# Patient Record
Sex: Male | Born: 1945 | Race: White | Hispanic: No | Marital: Married | Smoking: Current every day smoker
Health system: Southern US, Community
[De-identification: ages and names within clinical notes are randomized; demographics above are authoritative.]

## PROBLEM LIST (undated history)

## (undated) DIAGNOSIS — G20A1 Parkinson's disease without dyskinesia, without mention of fluctuations: Secondary | ICD-10-CM

## (undated) DIAGNOSIS — F0390 Unspecified dementia without behavioral disturbance: Secondary | ICD-10-CM

## (undated) DIAGNOSIS — G20 Parkinson's disease: Secondary | ICD-10-CM

## (undated) DIAGNOSIS — I10 Essential (primary) hypertension: Secondary | ICD-10-CM

## (undated) HISTORY — PX: CHOLECYSTECTOMY: SHX55

---

## 2017-01-18 ENCOUNTER — Emergency Department: Payer: Medicare Other

## 2017-01-18 ENCOUNTER — Encounter: Payer: Self-pay | Admitting: Emergency Medicine

## 2017-01-18 ENCOUNTER — Emergency Department
Admission: EM | Admit: 2017-01-18 | Discharge: 2017-01-18 | Disposition: A | Discharge status: Home / Self Care | Payer: Medicare Other | Attending: Emergency Medicine | Admitting: Emergency Medicine

## 2017-01-18 DIAGNOSIS — R296 Repeated falls: Secondary | ICD-10-CM | POA: Diagnosis not present

## 2017-01-18 DIAGNOSIS — Y9301 Activity, walking, marching and hiking: Secondary | ICD-10-CM | POA: Diagnosis not present

## 2017-01-18 DIAGNOSIS — W19XXXA Unspecified fall, initial encounter: Secondary | ICD-10-CM

## 2017-01-18 DIAGNOSIS — G20 Parkinson's disease: Secondary | ICD-10-CM | POA: Insufficient documentation

## 2017-01-18 DIAGNOSIS — Y92195 Garage of other specified residential institution as the place of occurrence of the external cause: Secondary | ICD-10-CM | POA: Insufficient documentation

## 2017-01-18 DIAGNOSIS — Y998 Other external cause status: Secondary | ICD-10-CM | POA: Insufficient documentation

## 2017-01-18 DIAGNOSIS — M6281 Muscle weakness (generalized): Secondary | ICD-10-CM | POA: Diagnosis present

## 2017-01-18 DIAGNOSIS — W010XXA Fall on same level from slipping, tripping and stumbling without subsequent striking against object, initial encounter: Secondary | ICD-10-CM | POA: Insufficient documentation

## 2017-01-18 DIAGNOSIS — M545 Low back pain: Secondary | ICD-10-CM | POA: Insufficient documentation

## 2017-01-18 HISTORY — DX: Essential (primary) hypertension: I10

## 2017-01-18 HISTORY — DX: Parkinson's disease: G20

## 2017-01-18 HISTORY — DX: Parkinson's disease without dyskinesia, without mention of fluctuations: G20.A1

## 2017-01-18 LAB — URINALYSIS, COMPLETE (UACMP) WITH MICROSCOPIC
BACTERIA UA: NONE SEEN
Bilirubin Urine: NEGATIVE
GLUCOSE, UA: NEGATIVE mg/dL
Hgb urine dipstick: NEGATIVE
KETONES UR: 5 mg/dL — AB
Leukocytes, UA: NEGATIVE
Nitrite: NEGATIVE
PROTEIN: 30 mg/dL — AB
Specific Gravity, Urine: 1.023 (ref 1.005–1.030)
pH: 6 (ref 5.0–8.0)

## 2017-01-18 LAB — CBC WITH DIFFERENTIAL/PLATELET
BASOS ABS: 0 10*3/uL (ref 0–0.1)
BASOS PCT: 0 %
EOS ABS: 0.3 10*3/uL (ref 0–0.7)
EOS PCT: 5 %
HCT: 37 % — ABNORMAL LOW (ref 40.0–52.0)
Hemoglobin: 12.7 g/dL — ABNORMAL LOW (ref 13.0–18.0)
Lymphocytes Relative: 39 %
Lymphs Abs: 2.8 10*3/uL (ref 1.0–3.6)
MCH: 30.4 pg (ref 26.0–34.0)
MCHC: 34.3 g/dL (ref 32.0–36.0)
MCV: 88.5 fL (ref 80.0–100.0)
Monocytes Absolute: 0.6 10*3/uL (ref 0.2–1.0)
Monocytes Relative: 9 %
Neutro Abs: 3.3 10*3/uL (ref 1.4–6.5)
Neutrophils Relative %: 47 %
PLATELETS: 216 10*3/uL (ref 150–440)
RBC: 4.18 MIL/uL — AB (ref 4.40–5.90)
RDW: 13.1 % (ref 11.5–14.5)
WBC: 7.1 10*3/uL (ref 3.8–10.6)

## 2017-01-18 LAB — COMPREHENSIVE METABOLIC PANEL
ALBUMIN: 4.2 g/dL (ref 3.5–5.0)
ALT: 19 U/L (ref 17–63)
AST: 23 U/L (ref 15–41)
Alkaline Phosphatase: 47 U/L (ref 38–126)
Anion gap: 4 — ABNORMAL LOW (ref 5–15)
BUN: 17 mg/dL (ref 6–20)
CHLORIDE: 106 mmol/L (ref 101–111)
CO2: 28 mmol/L (ref 22–32)
CREATININE: 0.87 mg/dL (ref 0.61–1.24)
Calcium: 8.9 mg/dL (ref 8.9–10.3)
GFR calc non Af Amer: >60 mL/min (ref >60)
GLUCOSE: 106 mg/dL — AB (ref 65–99)
Potassium: 3.3 mmol/L — ABNORMAL LOW (ref 3.5–5.1)
SODIUM: 138 mmol/L (ref 135–145)
Total Bilirubin: 0.6 mg/dL (ref 0.3–1.2)
Total Protein: 7 g/dL (ref 6.5–8.1)

## 2017-01-18 LAB — TROPONIN I: Troponin I: <0.03 ng/mL (ref <0.03)

## 2017-01-18 NOTE — ED Triage Notes (Signed)
Pt presents to ED 10 via EMS from home for multiple falls within the last 3 days; pt has hx of Parkinson's Disease. Pt c/o lower back pain, but nothing else. Pt is awake, alert and oriented x4. Pt's speaks in very low volume. Pt's wife at bedside.

## 2017-01-18 NOTE — ED Provider Notes (Signed)
[facility] Emergency Department Provider Note       Time seen: ----------------------------------------- 9:05 PM on 01/18/2017 -----------------------------------------  Level V caveat: History/ROS limited by altered mental status   I have reviewed the triage vital signs and the nursing notes.   HISTORY   Chief Complaint No chief complaint on file.    HPI Stephen Mcknight is a 71 y.o. male who presents to the ED for generalized weakness and multiple falls. Patient was brought in by EMS after falling in his carport. His wife states he has fallen multiple times over the past several days and has been increasingly weak. Reportedly has a history of Parkinson's disease. He is complaining of low back pain from the fall today and possibly hip pain but otherwise denies complaints.   No past medical history on file.  There are no active problems to display for this patient.   No past surgical history on file.  Allergies Patient has no allergy information on record.  Social History Social History  Substance Use Topics  . Smoking status: Not on file  . Smokeless tobacco: Not on file  . Alcohol use Not on file    Review of Systems Musculoskeletal: Positive for back pain  All systems negative/normal/unremarkable except as stated in the HPI  ____________________________________________   PHYSICAL EXAM:  VITAL SIGNS: ED Triage Vitals  Enc Vitals Group     BP      Pulse      Resp      Temp      Temp src      SpO2      Weight      Height      Head Circumference      Peak Flow      Pain Score      Pain Loc      Pain Edu?      Excl. in GC?     Constitutional: Lethargic and disoriented, no distress Eyes: Conjunctivae are normal. Normal extraocular movements. ENT   Head: Normocephalic and atraumatic.   Nose: No congestion/rhinnorhea.   Mouth/Throat: Mucous membranes are moist.   Neck: No stridor. Patient arrives with c-collar  in place Cardiovascular: Normal rate, regular rhythm. No murmurs, rubs, or gallops. Respiratory: Normal respiratory effort without tachypnea nor retractions. Breath sounds are clear and equal bilaterally. No wheezes/rales/rhonchi. Gastrointestinal: Soft and nontender. Normal bowel sounds Musculoskeletal: Limited range of motion of the extremities Neurologic: No gross focal neurologic deficits are appreciated. Generalized weakness, speaks in a quiet whisper Skin:  Skin is warm, dry and intact. No rash noted. ____________________________________________  EKG: Interpreted by me. Sinus rhythm with a rate of 67 bpm, normal PR interval, normal QRS, normal QT, normal axis.  ____________________________________________  ED COURSE:  Pertinent labs & imaging results that were available during my care of the patient were reviewed by me and considered in my medical decision making (see chart for details). Patient presents for weakness and frequent falls, we will assess with labs and imaging as indicated.   Procedures ____________________________________________   LABS (pertinent positives/negatives)  Labs Reviewed  CBC WITH DIFFERENTIAL/PLATELET - Abnormal; Notable for the following:       Result Value   RBC 4.18 (*)    Hemoglobin 12.7 (*)    HCT 37.0 (*)    All other components within normal limits  COMPREHENSIVE METABOLIC PANEL - Abnormal; Notable for the following:    Potassium 3.3 (*)    Glucose, Bld 106 (*)  Anion gap 4 (*)    All other components within normal limits  URINALYSIS, COMPLETE (UACMP) WITH MICROSCOPIC - Abnormal; Notable for the following:    Color, Urine YELLOW (*)    APPearance CLEAR (*)    Ketones, ur 5 (*)    Protein, ur 30 (*)    Squamous Epithelial / LPF 0-5 (*)    All other components within normal limits  TROPONIN I    RADIOLOGY  CT head, C-spine, lumbar spine, pelvis x-ray Unremarkable ____________________________________________  FINAL ASSESSMENT  AND PLAN  Frequent falls, weakness  Plan: Patient's labs and imaging were dictated above. Patient had presented for frequent falls and weakness secondary to Parkinson's that is advancing. I have discussed at length with the family, they are comfortable taking him home. He is adamant about not using a walker to ambulate. He is stable for discharge at this time.   Emily Filbert, MD   Note: This note was generated in part or whole with voice recognition software. Voice recognition is usually quite accurate but there are transcription errors that can and very often do occur. I apologize for any typographical errors that were not detected and corrected.     Emily Filbert, MD 01/18/17 2240

## 2017-11-29 ENCOUNTER — Encounter: Payer: Self-pay | Admitting: Emergency Medicine

## 2017-11-29 ENCOUNTER — Other Ambulatory Visit: Payer: Self-pay

## 2017-11-29 ENCOUNTER — Emergency Department
Admission: EM | Admit: 2017-11-29 | Discharge: 2017-11-29 | Disposition: A | Discharge status: Home / Self Care | Payer: Medicare Other | Attending: Emergency Medicine | Admitting: Emergency Medicine

## 2017-11-29 ENCOUNTER — Emergency Department: Payer: Medicare Other

## 2017-11-29 DIAGNOSIS — Y92018 Other place in single-family (private) house as the place of occurrence of the external cause: Secondary | ICD-10-CM | POA: Diagnosis not present

## 2017-11-29 DIAGNOSIS — G20 Parkinson's disease: Secondary | ICD-10-CM | POA: Insufficient documentation

## 2017-11-29 DIAGNOSIS — S8991XA Unspecified injury of right lower leg, initial encounter: Secondary | ICD-10-CM | POA: Diagnosis present

## 2017-11-29 DIAGNOSIS — W19XXXA Unspecified fall, initial encounter: Secondary | ICD-10-CM | POA: Diagnosis not present

## 2017-11-29 DIAGNOSIS — F17210 Nicotine dependence, cigarettes, uncomplicated: Secondary | ICD-10-CM | POA: Diagnosis not present

## 2017-11-29 DIAGNOSIS — Y999 Unspecified external cause status: Secondary | ICD-10-CM | POA: Insufficient documentation

## 2017-11-29 DIAGNOSIS — Y939 Activity, unspecified: Secondary | ICD-10-CM | POA: Insufficient documentation

## 2017-11-29 DIAGNOSIS — I10 Essential (primary) hypertension: Secondary | ICD-10-CM | POA: Diagnosis not present

## 2017-11-29 DIAGNOSIS — S8001XA Contusion of right knee, initial encounter: Secondary | ICD-10-CM | POA: Diagnosis not present

## 2017-11-29 LAB — CBC WITH DIFFERENTIAL/PLATELET
Basophils Absolute: 0 10*3/uL (ref 0–0.1)
Basophils Relative: 1 %
EOS ABS: 0.1 10*3/uL (ref 0–0.7)
Eosinophils Relative: 2 %
HCT: 37.5 % — ABNORMAL LOW (ref 40.0–52.0)
HEMOGLOBIN: 12.9 g/dL — AB (ref 13.0–18.0)
LYMPHS ABS: 1.9 10*3/uL (ref 1.0–3.6)
Lymphocytes Relative: 26 %
MCH: 30.9 pg (ref 26.0–34.0)
MCHC: 34.3 g/dL (ref 32.0–36.0)
MCV: 90.1 fL (ref 80.0–100.0)
Monocytes Absolute: 0.8 10*3/uL (ref 0.2–1.0)
Monocytes Relative: 11 %
NEUTROS ABS: 4.4 10*3/uL (ref 1.4–6.5)
NEUTROS PCT: 60 %
Platelets: 211 10*3/uL (ref 150–440)
RBC: 4.16 MIL/uL — AB (ref 4.40–5.90)
RDW: 13.1 % (ref 11.5–14.5)
WBC: 7.2 10*3/uL (ref 3.8–10.6)

## 2017-11-29 LAB — URINALYSIS, COMPLETE (UACMP) WITH MICROSCOPIC
BACTERIA UA: NONE SEEN
Bilirubin Urine: NEGATIVE
Glucose, UA: NEGATIVE mg/dL
Ketones, ur: 5 mg/dL — AB
Leukocytes, UA: NEGATIVE
Nitrite: NEGATIVE
Protein, ur: 100 mg/dL — AB
SPECIFIC GRAVITY, URINE: 1.02 (ref 1.005–1.030)
SQUAMOUS EPITHELIAL / LPF: NONE SEEN (ref 0–5)
pH: 6 (ref 5.0–8.0)

## 2017-11-29 LAB — COMPREHENSIVE METABOLIC PANEL
ALBUMIN: 4.1 g/dL (ref 3.5–5.0)
ALK PHOS: 87 U/L (ref 38–126)
ALT: 5 U/L — AB (ref 17–63)
AST: 22 U/L (ref 15–41)
Anion gap: 8 (ref 5–15)
BUN: 16 mg/dL (ref 6–20)
CALCIUM: 8.9 mg/dL (ref 8.9–10.3)
CO2: 27 mmol/L (ref 22–32)
CREATININE: 0.9 mg/dL (ref 0.61–1.24)
Chloride: 102 mmol/L (ref 101–111)
GFR calc non Af Amer: >60 mL/min (ref >60)
GLUCOSE: 110 mg/dL — AB (ref 65–99)
Potassium: 4 mmol/L (ref 3.5–5.1)
SODIUM: 137 mmol/L (ref 135–145)
Total Bilirubin: 0.4 mg/dL (ref 0.3–1.2)
Total Protein: 7.2 g/dL (ref 6.5–8.1)

## 2017-11-29 LAB — TROPONIN I

## 2017-11-29 MED ORDER — OXYCODONE-ACETAMINOPHEN 5-325 MG PO TABS
2.0000 | ORAL_TABLET | Freq: Once | ORAL | Status: AC
  Administered 2017-11-29: 2 via ORAL
  Filled 2017-11-29: qty 2

## 2017-11-29 NOTE — ED Triage Notes (Addendum)
PT to ED via EMS from home with c/o fall, pt c/o RT knee pain.Denies head injury,.  PT  .A&Ox4  MD at bedside . VSS

## 2017-11-29 NOTE — Care Management (Signed)
RNCM notified by CSW that patient is followed by [facility]. I have sent new orders to [facility]. CSW is providing daughter list of private duty agencies to accommodate patient in home with personal care under private pay.

## 2017-11-29 NOTE — ED Notes (Signed)
Pt ambulated with walker, steady gait noted. Per family pt at baseline ambulation. MD made aware

## 2017-11-29 NOTE — Clinical Social Work Note (Signed)
CSW received consult for "ED." CSW staffed with EDP Dr. Jimmye Norman. Patient from home with spouse. Patient presented to the ED for a fall and family wanting placement for patient. CSW met with patient, spouse, and daughter at bedside. Patient has Parkinson's and was sleeping when CSW in the room. Daughter concerned that patient needs placement into a [facility] (SNF), as patient's spouse has a brain tumor and, per daughter, is not able to care for patient. CSW explained the 3 night inpatient stay requirement for Medicare to pay for rehab and patient cannot pay privately for SNF. CSW discussed and provided resources for the process of placing in SNF from home, applying for Medicaid, Private duty nursing list, home health, DSS Services, and PACE information. Daughter agreeable for PACE referral. CSW faxed in PACE referral. Daughter stated patient has home health services with [facility] and would like to continue or increase those services. CSW updated RNCM Levada Dy and EDP Dr. Jimmye Norman. CSW signing off as no further Social Work interventions needed.   Oretha Ellis, Latanya Presser, [place] Social Worker-ED [phone]

## 2017-11-29 NOTE — ED Notes (Signed)
Pt wheeled to car with moderate assistance to get into vehicle.

## 2017-11-29 NOTE — ED Notes (Signed)
At discharge Pt family is requesting pt be admitted to nursing facility , unknown if he has PCP per granddaughter. MD made aware

## 2017-11-29 NOTE — ED Provider Notes (Addendum)
[facility] Emergency Department Provider Note       Time seen: ----------------------------------------- 7:54 AM on 11/29/2017 -----------------------------------------  Level V caveat: History/ROS limited by dementia I have reviewed the triage vital signs and the nursing notes.  HISTORY   Chief Complaint No chief complaint on file.    HPI Stephen Mcknight is a 72 y.o. male with a history of hypertension and Parkinson's disease who presents to the ED for a fall.  Patient is somewhat of a poor historian but his only complaint is right knee pain.  Patient presents from home after this fall, no explanation was given for the fall.  Patient does not think he hit his head and again he has no other complaints at this time.  The fall occurred sometime this morning.  Past Medical History:  Diagnosis Date  . Hypertension   . Parkinson disease (HCC)     There are no active problems to display for this patient.   Past Surgical History:  Procedure Laterality Date  . CHOLECYSTECTOMY      Allergies Patient has no allergy information on record.  Social History Social History   Tobacco Use  . Smoking status: Current Every Day Smoker    Packs/day: 1.00    Types: Cigarettes  . Smokeless tobacco: Never Used  Substance Use Topics  . Alcohol use: No  . Drug use: No   Review of Systems Constitutional: Negative for fever. Cardiovascular: Negative for chest pain. Respiratory: Negative for shortness of breath. Gastrointestinal: Negative for abdominal pain, vomiting and diarrhea. Musculoskeletal: Positive right knee pain Skin: Negative for rash. Neurological: Negative for headaches  All systems negative/normal/unremarkable except as stated in the HPI  ____________________________________________   PHYSICAL EXAM:  VITAL SIGNS: ED Triage Vitals  Enc Vitals Group     BP      Pulse      Resp      Temp      Temp src      SpO2      Weight      Height       Head Circumference      Peak Flow      Pain Score      Pain Loc      Pain Edu?      Excl. in GC?    Constitutional: Alert,  Well appearing and in no distress. Eyes: Conjunctivae are normal.  ENT   Head: Normocephalic and atraumatic.   Nose: No congestion/rhinnorhea.   Mouth/Throat: Mucous membranes are moist.   Neck: No stridor. Cardiovascular: Normal rate, regular rhythm. No murmurs, rubs, or gallops. Respiratory: Normal respiratory effort without tachypnea nor retractions. Breath sounds are clear and equal bilaterally. No wheezes/rales/rhonchi. Gastrointestinal: Soft and nontender Musculoskeletal: Pain with range of motion of the right knee, possible right knee contusion noted. Neurologic:  Normal speech and language. No gross focal neurologic deficits are appreciated.  Skin: Scattered contusions over the extremities Psychiatric: Flat affect ____________________________________________  EKG: Sinus rhythm rate 65 bpm, normal PR interval, borderline wide QRS, normal QT.  ED COURSE:  As part of my medical decision making, I reviewed the following data within the electronic MEDICAL RECORD NUMBER History obtained from family if available, nursing notes, old chart and ekg, as well as notes from prior ED visits. Patient presented for a fall, we will assess with imaging as indicated at this time.   Procedures ____________________________________________   Labs Reviewed  CBC WITH DIFFERENTIAL/PLATELET - Abnormal; Notable for the following components:  Result Value   RBC 4.16 (*)    Hemoglobin 12.9 (*)    HCT 37.5 (*)    All other components within normal limits  COMPREHENSIVE METABOLIC PANEL - Abnormal; Notable for the following components:   Glucose, Bld 110 (*)    ALT 5 (*)    All other components within normal limits  URINALYSIS, COMPLETE (UACMP) WITH MICROSCOPIC - Abnormal; Notable for the following components:   Color, Urine YELLOW (*)    APPearance CLEAR  (*)    Hgb urine dipstick SMALL (*)    Ketones, ur 5 (*)    Protein, ur 100 (*)    All other components within normal limits  TROPONIN I     RADIOLOGY Images were viewed by me  Right knee x-rays, pelvis x-ray Did not reveal any acute process ____________________________________________  DIFFERENTIAL DIAGNOSIS   Contusion, fracture, abrasion  FINAL ASSESSMENT AND PLAN  Fall, Parkinson's disease, right knee contusion   Plan: The patient had presented for right knee pain from a fall. Patient's imaging did not reveal any fractures, he was able to ambulate here using a walker which is his normal.  Medically he does not meet any admission criteria,  he is cleared for outpatient follow-up.   Ulice Dash, MD   Note: This note was generated in part or whole with voice recognition software. Voice recognition is usually quite accurate but there are transcription errors that can and very often do occur. I apologize for any typographical errors that were not detected and corrected.     Emily Filbert, MD 11/29/17 1610    Emily Filbert, MD 11/29/17 [phone]

## 2019-02-20 ENCOUNTER — Emergency Department: Payer: Medicare Other

## 2019-02-20 ENCOUNTER — Other Ambulatory Visit: Payer: Self-pay

## 2019-02-20 ENCOUNTER — Inpatient Hospital Stay
Admission: EM | Admit: 2019-02-20 | Discharge: 2019-02-24 | DRG: 558 | Disposition: A | Discharge status: Skilled Nursing Facility | Payer: Medicare Other | Attending: Internal Medicine | Admitting: Internal Medicine

## 2019-02-20 DIAGNOSIS — F17210 Nicotine dependence, cigarettes, uncomplicated: Secondary | ICD-10-CM | POA: Diagnosis present

## 2019-02-20 DIAGNOSIS — R7989 Other specified abnormal findings of blood chemistry: Secondary | ICD-10-CM | POA: Diagnosis present

## 2019-02-20 DIAGNOSIS — R296 Repeated falls: Secondary | ICD-10-CM | POA: Diagnosis present

## 2019-02-20 DIAGNOSIS — G20 Parkinson's disease: Secondary | ICD-10-CM | POA: Diagnosis present

## 2019-02-20 DIAGNOSIS — M25551 Pain in right hip: Secondary | ICD-10-CM | POA: Diagnosis present

## 2019-02-20 DIAGNOSIS — Z66 Do not resuscitate: Secondary | ICD-10-CM | POA: Diagnosis present

## 2019-02-20 DIAGNOSIS — I10 Essential (primary) hypertension: Secondary | ICD-10-CM | POA: Diagnosis present

## 2019-02-20 DIAGNOSIS — Z20828 Contact with and (suspected) exposure to other viral communicable diseases: Secondary | ICD-10-CM | POA: Diagnosis present

## 2019-02-20 DIAGNOSIS — W19XXXA Unspecified fall, initial encounter: Secondary | ICD-10-CM | POA: Diagnosis present

## 2019-02-20 DIAGNOSIS — S0011XA Contusion of right eyelid and periocular area, initial encounter: Secondary | ICD-10-CM | POA: Diagnosis present

## 2019-02-20 DIAGNOSIS — R531 Weakness: Secondary | ICD-10-CM | POA: Diagnosis present

## 2019-02-20 DIAGNOSIS — R011 Cardiac murmur, unspecified: Secondary | ICD-10-CM | POA: Diagnosis present

## 2019-02-20 DIAGNOSIS — R55 Syncope and collapse: Secondary | ICD-10-CM

## 2019-02-20 DIAGNOSIS — Z833 Family history of diabetes mellitus: Secondary | ICD-10-CM | POA: Diagnosis not present

## 2019-02-20 DIAGNOSIS — I248 Other forms of acute ischemic heart disease: Secondary | ICD-10-CM | POA: Diagnosis present

## 2019-02-20 DIAGNOSIS — M25522 Pain in left elbow: Secondary | ICD-10-CM | POA: Diagnosis present

## 2019-02-20 DIAGNOSIS — M6282 Rhabdomyolysis: Principal | ICD-10-CM | POA: Diagnosis present

## 2019-02-20 DIAGNOSIS — Z7982 Long term (current) use of aspirin: Secondary | ICD-10-CM

## 2019-02-20 DIAGNOSIS — E876 Hypokalemia: Secondary | ICD-10-CM | POA: Diagnosis present

## 2019-02-20 LAB — URINALYSIS, COMPLETE (UACMP) WITH MICROSCOPIC
Bacteria, UA: NONE SEEN
Bilirubin Urine: NEGATIVE
Glucose, UA: NEGATIVE mg/dL
Ketones, ur: 20 mg/dL — AB
Leukocytes,Ua: NEGATIVE
Nitrite: NEGATIVE
Protein, ur: 100 mg/dL — AB
Specific Gravity, Urine: 1.023 (ref 1.005–1.030)
pH: 5 (ref 5.0–8.0)

## 2019-02-20 LAB — COMPREHENSIVE METABOLIC PANEL
ALT: 30 U/L (ref 0–44)
AST: 31 U/L (ref 15–41)
Albumin: 4.7 g/dL (ref 3.5–5.0)
Alkaline Phosphatase: 51 U/L (ref 38–126)
Anion gap: 10 (ref 5–15)
BUN: 32 mg/dL — ABNORMAL HIGH (ref 8–23)
CO2: 26 mmol/L (ref 22–32)
Calcium: 9.2 mg/dL (ref 8.9–10.3)
Chloride: 103 mmol/L (ref 98–111)
Creatinine, Ser: 0.97 mg/dL (ref 0.61–1.24)
GFR calc Af Amer: >60 mL/min (ref >60)
GFR calc non Af Amer: >60 mL/min (ref >60)
Glucose, Bld: 140 mg/dL — ABNORMAL HIGH (ref 70–99)
Potassium: 3.9 mmol/L (ref 3.5–5.1)
Sodium: 139 mmol/L (ref 135–145)
Total Bilirubin: 0.7 mg/dL (ref 0.3–1.2)
Total Protein: 7.9 g/dL (ref 6.5–8.1)

## 2019-02-20 LAB — CK: Total CK: 721 U/L — ABNORMAL HIGH (ref 49–397)

## 2019-02-20 LAB — CBC WITH DIFFERENTIAL/PLATELET
Abs Immature Granulocytes: 0.04 10*3/uL (ref 0.00–0.07)
Basophils Absolute: 0 10*3/uL (ref 0.0–0.1)
Basophils Relative: 0 %
Eosinophils Absolute: 0 10*3/uL (ref 0.0–0.5)
Eosinophils Relative: 0 %
HCT: 37 % — ABNORMAL LOW (ref 39.0–52.0)
Hemoglobin: 12.7 g/dL — ABNORMAL LOW (ref 13.0–17.0)
Immature Granulocytes: 0 %
Lymphocytes Relative: 4 %
Lymphs Abs: 0.5 10*3/uL — ABNORMAL LOW (ref 0.7–4.0)
MCH: 30.6 pg (ref 26.0–34.0)
MCHC: 34.3 g/dL (ref 30.0–36.0)
MCV: 89.2 fL (ref 80.0–100.0)
Monocytes Absolute: 0.4 10*3/uL (ref 0.1–1.0)
Monocytes Relative: 3 %
Neutro Abs: 11.7 10*3/uL — ABNORMAL HIGH (ref 1.7–7.7)
Neutrophils Relative %: 93 %
Platelets: 209 10*3/uL (ref 150–400)
RBC: 4.15 MIL/uL — ABNORMAL LOW (ref 4.22–5.81)
RDW: 12 % (ref 11.5–15.5)
WBC: 12.7 10*3/uL — ABNORMAL HIGH (ref 4.0–10.5)
nRBC: 0 % (ref 0.0–0.2)

## 2019-02-20 LAB — SARS CORONAVIRUS 2 BY RT PCR (HOSPITAL ORDER, PERFORMED IN ~~LOC~~ HOSPITAL LAB): SARS Coronavirus 2: NEGATIVE

## 2019-02-20 LAB — TROPONIN I (HIGH SENSITIVITY)
Troponin I (High Sensitivity): 65 ng/L — ABNORMAL HIGH (ref <18)
Troponin I (High Sensitivity): 93 ng/L — ABNORMAL HIGH (ref <18)
Troponin I (High Sensitivity): 92 ng/L — ABNORMAL HIGH (ref <18)

## 2019-02-20 MED ORDER — ADULT MULTIVITAMIN W/MINERALS CH
1.0000 | ORAL_TABLET | Freq: Every day | ORAL | Status: DC
  Administered 2019-02-21 – 2019-02-24 (×4): 1 via ORAL
  Filled 2019-02-20 (×5): qty 1

## 2019-02-20 MED ORDER — METHOCARBAMOL 500 MG PO TABS
1000.0000 mg | ORAL_TABLET | Freq: Every evening | ORAL | Status: DC | PRN
  Filled 2019-02-20: qty 2

## 2019-02-20 MED ORDER — SODIUM CHLORIDE 0.9 % IV SOLN
INTRAVENOUS | Status: DC
  Administered 2019-02-20 – 2019-02-24 (×9): via INTRAVENOUS

## 2019-02-20 MED ORDER — PANTOPRAZOLE SODIUM 40 MG PO TBEC
40.0000 mg | DELAYED_RELEASE_TABLET | Freq: Every day | ORAL | Status: DC
  Administered 2019-02-21 – 2019-02-24 (×4): 40 mg via ORAL
  Filled 2019-02-20 (×5): qty 1

## 2019-02-20 MED ORDER — CARBIDOPA-LEVODOPA 25-100 MG PO TABS
2.0000 | ORAL_TABLET | Freq: Every day | ORAL | Status: DC
  Administered 2019-02-21 – 2019-02-24 (×16): 2 via ORAL
  Filled 2019-02-20 (×24): qty 2

## 2019-02-20 MED ORDER — SODIUM CHLORIDE 0.9 % IV BOLUS
500.0000 mL | Freq: Once | INTRAVENOUS | Status: AC
  Administered 2019-02-20: 500 mL via INTRAVENOUS

## 2019-02-20 MED ORDER — OXYBUTYNIN CHLORIDE 5 MG PO TABS
5.0000 mg | ORAL_TABLET | Freq: Every day | ORAL | Status: DC
  Administered 2019-02-21 – 2019-02-23 (×3): 5 mg via ORAL
  Filled 2019-02-20 (×3): qty 1

## 2019-02-20 MED ORDER — ENOXAPARIN SODIUM 40 MG/0.4ML ~~LOC~~ SOLN
40.0000 mg | SUBCUTANEOUS | Status: DC
  Administered 2019-02-20 – 2019-02-23 (×4): 40 mg via SUBCUTANEOUS
  Filled 2019-02-20 (×4): qty 0.4

## 2019-02-20 MED ORDER — MIRTAZAPINE 15 MG PO TABS
15.0000 mg | ORAL_TABLET | Freq: Every day | ORAL | Status: DC
  Administered 2019-02-21 – 2019-02-23 (×3): 15 mg via ORAL
  Filled 2019-02-20 (×3): qty 1

## 2019-02-20 MED ORDER — ASPIRIN EC 81 MG PO TBEC
81.0000 mg | DELAYED_RELEASE_TABLET | Freq: Every day | ORAL | Status: DC
  Administered 2019-02-21 – 2019-02-24 (×4): 81 mg via ORAL
  Filled 2019-02-20 (×5): qty 1

## 2019-02-20 MED ORDER — PRAMIPEXOLE DIHYDROCHLORIDE 0.25 MG PO TABS
0.5000 mg | ORAL_TABLET | Freq: Two times a day (BID) | ORAL | Status: DC
  Administered 2019-02-21 – 2019-02-24 (×6): 0.5 mg via ORAL
  Filled 2019-02-20 (×7): qty 2

## 2019-02-20 MED ORDER — MULTIVITAMINS PO CAPS: 1.0000 | ORAL_CAPSULE | Freq: Every day | ORAL | Status: DC

## 2019-02-20 MED ORDER — DOCUSATE SODIUM 100 MG PO CAPS: 100.0000 mg | ORAL_CAPSULE | Freq: Two times a day (BID) | ORAL | Status: DC | PRN

## 2019-02-20 MED ORDER — LABETALOL HCL 5 MG/ML IV SOLN
10.0000 mg | Freq: Once | INTRAVENOUS | Status: AC
  Administered 2019-02-20: 18:00:00 10 mg via INTRAVENOUS
  Filled 2019-02-20: qty 4

## 2019-02-20 NOTE — H&P (Addendum)
agree with detailed note below. Patient presentation and plan discussed on rounds with APP.    [facility] [place] [facility]   PATIENT NAME: Stephen Mcknight    MR#:  [id number]  DATE OF BIRTH:  1945-12-23  DATE OF ADMISSION:  02/20/2019  PRIMARY CARE PHYSICIAN: Keplinger, Tawny Asal, MD   REQUESTING/REFERRING PHYSICIAN: Ileana Roup, MD  CHIEF COMPLAINT:   Chief Complaint  Patient presents with   Fall   Hip Pain   Arm Pain    HISTORY OF PRESENT ILLNESS:  73 year old male with pertinent past medical history of Parkinson's disease, strabismus, hypertension, and heart murmur who was found down by neighbor this morning.  Patient's daughter, patient has gait difficulty and gait freezing episode due to Parkinson's disease, he has trouble initiating movement and sometimes loses his balance.  He apparently has a sitter coming in to stay with him Monday through Friday.  Per patient's daughter, patient sister saw him on Wednesday and found out that he had sent to the sitter away on Monday.  This morning patient's neighbor went to check on him and found him lying face down on the floor besides the bed he apparently was trying to put his clothes on when he fell.  On arrival to the ED, he was afebrile with blood pressure 160/112 mm Hg and pulse rate 83 beats/min. There were no focal neurological deficits; he was alert but unsure exactly when he fell last he thinks he might of been during the night after taking a shower.  He does not remember the events prior to or following the fall.  On exam he was noted to have hematoma above right eye and right eye appears to have yellow discharge.  Initial labs revealed CK of 721, unremarkable CMP, WBC 12.7, initial troponin  65, urinalysis without evidence of UTI.  CT head was negative for acute intracranial abnormality, CT cervical spine showed no fracture or acute finding, CT maxillofacial showed right periorbital hematoma extending to the  inferior right forehead and right cheek. Patient will be admitted under hospitalist service for further management.  PAST MEDICAL HISTORY:   Past Medical History:  Diagnosis Date   Hypertension    Parkinson disease (HCC)     PAST SURGICAL HISTORY:   Past Surgical History:  Procedure Laterality Date   CHOLECYSTECTOMY      SOCIAL HISTORY:   Social History   Tobacco Use   Smoking status: Current Every Day Smoker    Packs/day: 1.00    Types: Cigarettes   Smokeless tobacco: Never Used  Substance Use Topics   Alcohol use: No    FAMILY HISTORY:   Family History  Problem Relation Age of Onset   Diabetes Mother    Cancer Father     DRUG ALLERGIES:  No Known Allergies  REVIEW OF SYSTEMS:   ROS Unable to obtain from patient due to minimally unresponsiveness only answers brief questions.  MEDICATIONS AT HOME:   Prior to Admission medications   Medication Sig Start Date End Date Taking? Authorizing Provider  aspirin EC 81 MG tablet Take 81 mg by mouth daily.   Yes [provider]  carbidopa-levodopa (SINEMET IR) 25-100 MG tablet Take 2 tablets by mouth 5 (five) times daily.   Yes [provider]  docusate sodium (COLACE) 100 MG capsule Take 100 mg by mouth 2 (two) times daily as needed for mild constipation or moderate constipation.    Yes [provider]  methocarbamol (ROBAXIN) 500 MG tablet  Take 1,000 mg by mouth at bedtime as needed for muscle spasms.    Yes [provider]  mirtazapine (REMERON) 15 MG tablet Take 15 mg by mouth at bedtime.    Yes [provider]  Multiple Vitamin (MULTIVITAMIN) capsule Take 1 capsule by mouth daily.   Yes [provider]  omeprazole (PRILOSEC) 20 MG capsule Take 40 mg by mouth daily.    Yes [provider]  oxybutynin (DITROPAN) 5 MG tablet Take 5 mg by mouth at bedtime.   Yes [provider]  pramipexole (MIRAPEX) 1 MG tablet Take 0.5 mg by mouth 2 (two)  times daily.   Yes [provider]      VITAL SIGNS:  Blood pressure (!) 171/89, pulse 70, temperature 98.9 F (37.2 C), temperature source Axillary, resp. rate 17, height 6\' 2"  (1.88 m), weight 60.1 kg, SpO2 100 %.  PHYSICAL EXAMINATION:   Physical Exam  GENERAL:  73 y.o.-year-old patient lying in the bed with no acute distress.  EYES: Pupils equal, round, reactive to light and accommodation. No scleral icterus. Extraocular muscles intact.  HEENT: Right periorbital hematoma, normocephalic. Oropharynx and nasopharynx clear.  NECK:  Supple, no jugular venous distention. No thyroid enlargement, no tenderness.  LUNGS: Normal breath sounds bilaterally, no wheezing, rales,rhonchi or crepitation. No use of accessory muscles of respiration.  CARDIOVASCULAR: S1, S2 normal.  Murmur present, rubs, or gallops.  ABDOMEN: Soft, nontender, nondistended. Bowel sounds present. No organomegaly or mass.  EXTREMITIES: No pedal edema, cyanosis, or clubbing. No rash or lesions. + pedal pulses MUSCULOSKELETAL: Normal bulk, and power was 5+ grip and elbow, knee, and ankle flexion and extension bilaterally.  NEUROLOGIC: asleep but arouses to deep sternal rub to answer questions.  Unable to assess orientation. Pseudophakia in both eyes.  Unable to assess rest of CN reliably.  Withdraws bilateral upper and lower extremity to painful stimuli.  Babinski is downgoing. DTR's (biceps, patellar, and achilles) 2+ and symmetric throughout. Gait not tested due to safety concern. PSYCHIATRIC: Unable to assess orientation SKIN: No obvious rash, lesion, or ulcer.   DATA REVIEWED:  LABORATORY PANEL:   CBC Recent Labs  Lab 02/20/19 1020  WBC 12.7*  HGB 12.7*  HCT 37.0*  PLT 209   ------------------------------------------------------------------------------------------------------------------  Chemistries  Recent Labs  Lab 02/20/19 1020  NA 139  K 3.9  CL 103  CO2 26  GLUCOSE 140*  BUN 32*    CREATININE 0.97  CALCIUM 9.2  AST 31  ALT 30  ALKPHOS 51  BILITOT 0.7   ------------------------------------------------------------------------------------------------------------------  Cardiac Enzymes No results for input(s): TROPONINI in the last 168 hours. ------------------------------------------------------------------------------------------------------------------  RADIOLOGY:  Ct Head Wo Contrast  Result Date: 02/20/2019 CLINICAL DATA:  Pt arrived from home via EMS for report of fall sometime between yesterday and today - he was found naked laying face down in floor beside bed - pt has a sitter Mon-Fri that he sent home Tuesday and since then has fallen 3 times - he is c/o lt arm pain and right hip pain - pt noted to have hematoma above right eye and right eye appears to have yellow discharge EXAM: CT HEAD WITHOUT CONTRAST CT MAXILLOFACIAL WITHOUT CONTRAST CT CERVICAL SPINE WITHOUT CONTRAST TECHNIQUE: Multidetector CT imaging of the head, cervical spine, and maxillofacial structures were performed using the standard protocol without intravenous contrast. Multiplanar CT image reconstructions of the cervical spine and maxillofacial structures were also generated. COMPARISON:  Head CT, 06/28/2018.  Cervical spine CT, 01/18/2017. FINDINGS: CT HEAD  FINDINGS Brain: No evidence of acute infarction, hemorrhage, hydrocephalus, extra-axial collection or mass lesion/mass effect. Vascular: No hyperdense vessel or unexpected calcification. Skull: Normal. Negative for fracture or focal lesion. Other: Right inferior frontal scalp hematoma extending to the periorbital soft tissues. CT MAXILLOFACIAL FINDINGS Osseous: No fracture or bone lesion. Orbits: There is preseptal right periorbital soft tissue swelling/hemorrhage extending to the periorbital soft tissues, and along the right cheek and lower right forehead. No injury to the right globe or postseptal orbit. Left globe and orbit are unremarkable.  Sinuses: Clear sinuses, mastoid air cells and middle ear cavities. Soft tissues: No soft tissue masses.  No adenopathy. CT CERVICAL SPINE FINDINGS Alignment: Normal. Skull base and vertebrae: No acute fracture. No primary bone lesion or focal pathologic process. Soft tissues and spinal canal: No prevertebral fluid or swelling. No visible canal hematoma. No soft tissue masses or adenopathy. Disc levels: Moderate loss of disc height at C5-C6 and C6-C7 with spondylotic disc bulging and endplate spurring. Facet degenerative changes are noted most evident the left at C2-C3 and C4-C5. No convincing disc herniation. Upper chest: No acute findings. Chronic scarring at the lung apices. Other: None. IMPRESSION: HEAD CT 1. No intracranial abnormality. 2. No skull fracture. MAXILLOFACIAL CT 1. Right periorbital soft tissue swelling/hematoma extending to the inferior right forehead and right cheek. No injury to the right globe or postseptal orbit. No other acute finding. 2. No fractures. CERVICAL CT 1. No fracture or acute finding. Electronically Signed   By: Amie Portland M.D.   On: 02/20/2019 11:35   Ct Cervical Spine Wo Contrast  Result Date: 02/20/2019 CLINICAL DATA:  Pt arrived from home via EMS for report of fall sometime between yesterday and today - he was found naked laying face down in floor beside bed - pt has a sitter Mon-Fri that he sent home Tuesday and since then has fallen 3 times - he is c/o lt arm pain and right hip pain - pt noted to have hematoma above right eye and right eye appears to have yellow discharge EXAM: CT HEAD WITHOUT CONTRAST CT MAXILLOFACIAL WITHOUT CONTRAST CT CERVICAL SPINE WITHOUT CONTRAST TECHNIQUE: Multidetector CT imaging of the head, cervical spine, and maxillofacial structures were performed using the standard protocol without intravenous contrast. Multiplanar CT image reconstructions of the cervical spine and maxillofacial structures were also generated. COMPARISON:  Head CT,  06/28/2018.  Cervical spine CT, 01/18/2017. FINDINGS: CT HEAD FINDINGS Brain: No evidence of acute infarction, hemorrhage, hydrocephalus, extra-axial collection or mass lesion/mass effect. Vascular: No hyperdense vessel or unexpected calcification. Skull: Normal. Negative for fracture or focal lesion. Other: Right inferior frontal scalp hematoma extending to the periorbital soft tissues. CT MAXILLOFACIAL FINDINGS Osseous: No fracture or bone lesion. Orbits: There is preseptal right periorbital soft tissue swelling/hemorrhage extending to the periorbital soft tissues, and along the right cheek and lower right forehead. No injury to the right globe or postseptal orbit. Left globe and orbit are unremarkable. Sinuses: Clear sinuses, mastoid air cells and middle ear cavities. Soft tissues: No soft tissue masses.  No adenopathy. CT CERVICAL SPINE FINDINGS Alignment: Normal. Skull base and vertebrae: No acute fracture. No primary bone lesion or focal pathologic process. Soft tissues and spinal canal: No prevertebral fluid or swelling. No visible canal hematoma. No soft tissue masses or adenopathy. Disc levels: Moderate loss of disc height at C5-C6 and C6-C7 with spondylotic disc bulging and endplate spurring. Facet degenerative changes are noted most evident the left at C2-C3 and C4-C5. No convincing disc herniation.  Upper chest: No acute findings. Chronic scarring at the lung apices. Other: None. IMPRESSION: HEAD CT 1. No intracranial abnormality. 2. No skull fracture. MAXILLOFACIAL CT 1. Right periorbital soft tissue swelling/hematoma extending to the inferior right forehead and right cheek. No injury to the right globe or postseptal orbit. No other acute finding. 2. No fractures. CERVICAL CT 1. No fracture or acute finding. Electronically Signed   By: Amie Portlandavid  Ormond M.D.   On: 02/20/2019 11:35   Dg Chest Port 1 View  Result Date: 02/20/2019 CLINICAL DATA:  Pt arrived from home via EMS for report of fall sometime  between yesterday and today - he was found naked laying face down in floor beside bed - pt has a sitter Mon-Fri that he sent home Tuesday and since then has fallen 3 times - he is c/o lt arm pain and right hip pain - pt noted to have hematoma above right eye and right eye appears to have yellow discharge EXAM: PORTABLE CHEST 1 VIEW COMPARISON:  06/28/2018 FINDINGS: Cardiac silhouette is normal in size. No mediastinal or hilar masses. There is no evidence of adenopathy. Clear lungs.  No pleural effusion or pneumothorax. Skeletal structures are demineralized but grossly intact. IMPRESSION: No active disease. Electronically Signed   By: Amie Portlandavid  Ormond M.D.   On: 02/20/2019 11:25   Dg Humerus Left  Result Date: 02/20/2019 CLINICAL DATA:  Pt arrived from home via EMS for report of fall sometime between yesterday and today - he was found naked laying face down in floor beside bed - pt has a sitter Mon-Fri that he sent home Tuesday and since then has fallen 3 times - he is c/o lt arm pain and right hip pain - pt noted to have hematoma above right eye and right eye appears to have yellow discharge EXAM: LEFT HUMERUS - 2+ VIEW COMPARISON:  None. FINDINGS: No fracture or bone lesion. Surgical screw in the superior aspect the greater tuberosity of the proximal humerus consistent with previous rotator repair. Glenohumeral and elbow joint normally spaced and aligned. Small metal foreign body projects in the superficial soft tissues of the distal arm anteriorly. IMPRESSION: 1. No fracture or dislocation. Electronically Signed   By: Amie Portlandavid  Ormond M.D.   On: 02/20/2019 11:25   Dg Hip Unilat W Or Wo Pelvis 2-3 Views Right  Result Date: 02/20/2019 CLINICAL DATA:  Pt arrived from home via EMS for report of fall sometime between yesterday and today - he was found naked laying face down in floor beside bed - pt has a sitter Mon-Fri that he sent home Tuesday and since then has fallen 3 times - he is c/o lt arm pain and right hip  pain - pt noted to have hematoma above right eye and right eye appears to have yellow discharge EXAM: DG HIP (WITH OR WITHOUT PELVIS) 2-3V RIGHT COMPARISON:  None. FINDINGS: No fracture or bone lesion. Hip joints, SI joints and symphysis pubis are normally spaced and aligned. Tissue shows scattered arterial vascular calcifications but are otherwise unremarkable. IMPRESSION: No fracture or dislocation Electronically Signed   By: Amie Portlandavid  Ormond M.D.   On: 02/20/2019 11:23   Ct Maxillofacial Wo Contrast  Result Date: 02/20/2019 CLINICAL DATA:  Pt arrived from home via EMS for report of fall sometime between yesterday and today - he was found naked laying face down in floor beside bed - pt has a sitter Mon-Fri that he sent home Tuesday and since then has fallen 3 times - he is  c/o lt arm pain and right hip pain - pt noted to have hematoma above right eye and right eye appears to have yellow discharge EXAM: CT HEAD WITHOUT CONTRAST CT MAXILLOFACIAL WITHOUT CONTRAST CT CERVICAL SPINE WITHOUT CONTRAST TECHNIQUE: Multidetector CT imaging of the head, cervical spine, and maxillofacial structures were performed using the standard protocol without intravenous contrast. Multiplanar CT image reconstructions of the cervical spine and maxillofacial structures were also generated. COMPARISON:  Head CT, 06/28/2018.  Cervical spine CT, 01/18/2017. FINDINGS: CT HEAD FINDINGS Brain: No evidence of acute infarction, hemorrhage, hydrocephalus, extra-axial collection or mass lesion/mass effect. Vascular: No hyperdense vessel or unexpected calcification. Skull: Normal. Negative for fracture or focal lesion. Other: Right inferior frontal scalp hematoma extending to the periorbital soft tissues. CT MAXILLOFACIAL FINDINGS Osseous: No fracture or bone lesion. Orbits: There is preseptal right periorbital soft tissue swelling/hemorrhage extending to the periorbital soft tissues, and along the right cheek and lower right forehead. No injury to  the right globe or postseptal orbit. Left globe and orbit are unremarkable. Sinuses: Clear sinuses, mastoid air cells and middle ear cavities. Soft tissues: No soft tissue masses.  No adenopathy. CT CERVICAL SPINE FINDINGS Alignment: Normal. Skull base and vertebrae: No acute fracture. No primary bone lesion or focal pathologic process. Soft tissues and spinal canal: No prevertebral fluid or swelling. No visible canal hematoma. No soft tissue masses or adenopathy. Disc levels: Moderate loss of disc height at C5-C6 and C6-C7 with spondylotic disc bulging and endplate spurring. Facet degenerative changes are noted most evident the left at C2-C3 and C4-C5. No convincing disc herniation. Upper chest: No acute findings. Chronic scarring at the lung apices. Other: None. IMPRESSION: HEAD CT 1. No intracranial abnormality. 2. No skull fracture. MAXILLOFACIAL CT 1. Right periorbital soft tissue swelling/hematoma extending to the inferior right forehead and right cheek. No injury to the right globe or postseptal orbit. No other acute finding. 2. No fractures. CERVICAL CT 1. No fracture or acute finding. Electronically Signed   By: Amie Portlandavid  Ormond M.D.   On: 02/20/2019 11:35   EKG:  EKG: normal EKG, normal sinus rhythm, unchanged from previous tracings. Vent. rate 84 BPM PR interval * ms QRS duration 98 ms QT/QTc 369/437 ms P-R-T axes 75 55 41 IMPRESSION AND PLAN:   73 y.o. male pertinent past medical history of Parkinson's disease, strabismus, hypertension, and heart murmur who was found down by neighbor this morning.  1. Fall -unclear etiology of fall although suspect complications of Parkinson's due to history of gait freezing and stiffness with multiple falls - Admit to medsurg unit - CT head reviewed and did not show any acute intracranial abnormality - CT cervical spine negative for cervical fracture  * CT maxillofacial shows right periorbital hematoma - X -ray of hip negative for fracture - Check  echocardiogram - Check ultrasound carotids - PT/OT consult  2. Rhabdomyolysis - elevated CK on admission likely due to prolonged immobilization (found down) - No evidence of AKI with slightly elevated BUN - Aggressive IVFs  hydration  - Monitor urinary output goal  - Monitor for electrolyte abnormalities - Recheck CK  3. Elevated troponin - Likely due to demand ischemia - Denies chest pain - Continue to trend - Consider Cardiology consult and probably heparin gtt if troponin remains elevated  4. Hypertension -elevated - PRN labetalol and hydralazine  5. Parkinson's disease - Continue Sinemet and Mirapex - [facility]   All the records are reviewed and case discussed  with ED provider. Management plans discussed with the patient, family and they are in agreement.  CODE STATUS: FULL  TOTAL TIME TAKING CARE OF THIS PATIENT: 50 minutes.     02/20/2019 at 6:24 PM   Webb SilversmithElizabeth Octavia Mottola, DNP, FNP-BC [facility] Nurse Practitioner Between 7am to 6pm - Pager [phone]  After 6pm go to [email or web address] - Social research officer, governmentpassword [email or web address]  [facility]  Office  [phone]  CC: Primary care physician; Hardie LoraKeplinger, Tawny AsalLynn E, MD

## 2019-02-20 NOTE — Progress Notes (Signed)
CRITICAL VALUE ALERT  Critical Value:  Troponin 93 Date & Time Notied:  02/20/2019 @ 4847 Provider Notified:[place] Orders Received/Actions taken: No new orders

## 2019-02-20 NOTE — ED Notes (Signed)
Secretary notified to call for pt transport

## 2019-02-20 NOTE — ED Provider Notes (Signed)
[facility] Emergency Department Provider Note  ____________________________________________   I have reviewed the triage vital signs and the nursing notes. Where available I have reviewed prior notes and, if possible and indicated, outside hospital notes.   Patient seen and evaluated during the coronavirus epidemic during a time with low staffing  Patient seen for the symptoms described in the history of present illness. She was evaluated in the context of the global COVID-19 pandemic, which necessitated consideration that the patient might be at risk for infection with the SARS-CoV-2 virus that causes COVID-19. Institutional protocols and algorithms that pertain to the evaluation of patients at risk for COVID-19 are in a state of rapid change based on information released by regulatory bodies including the CDC and federal and state organizations. These policies and algorithms were followed during the patient's care in the ED.    HISTORY  Chief Complaint Fall, Hip Pain, and Arm Pain    HPI Stephen Mcknight is a 73 y.o. male    Presents here today after several falls.  History is somewhat limited.  Patient does have a history of Parkinson's disease.  According to EMS, he just missed his home health care and has been following since that time.  He states he is not sure exactly when he fell last.  He thinks it was during the night.  He was in the shower.  He is not sure why he fell.  He was found by EMS facedown on the ground.  Is unclear exactly who called.  Patient had evidence of facial trauma.  He was otherwise in no acute distress.  He has been complaining of left elbow pain right hip pain and feeling generally weak but not particularly weak.  Level 5 chart caveat; no further history available due to patient status.  Past Medical History:  Diagnosis Date  . Hypertension   . Parkinson disease (HCC)     There are no active problems to display for this  patient.   Past Surgical History:  Procedure Laterality Date  . CHOLECYSTECTOMY      Prior to Admission medications   Medication Sig Start Date End Date Taking? Authorizing Provider  aspirin EC 81 MG tablet Take 81 mg by mouth daily.   Yes [provider]  carbidopa-levodopa (SINEMET IR) 25-100 MG tablet Take 2 tablets by mouth 5 (five) times daily.   Yes [provider]  docusate sodium (COLACE) 100 MG capsule Take 100 mg by mouth 2 (two) times daily.   Yes [provider]  methocarbamol (ROBAXIN) 750 MG tablet Take 750 mg by mouth 3 (three) times daily as needed for muscle spasms.   Yes [provider]  mirtazapine (REMERON) 15 MG tablet Take 7.5 mg by mouth at bedtime.   Yes [provider]  Multiple Vitamin (MULTIVITAMIN) capsule Take 1 capsule by mouth daily.   Yes [provider]  omeprazole (PRILOSEC) 20 MG capsule Take 20 mg by mouth 2 (two) times daily before a meal.   Yes [provider]  cholecalciferol (VITAMIN D) 1000 units tablet Take 1,000 Units by mouth daily.    [provider]  ferrous sulfate 325 (65 FE) MG tablet Take 325 mg by mouth 2 (two) times a week.    [provider]  loratadine (CLARITIN) 10 MG tablet Take 10 mg by mouth daily.    [provider]    Allergies Patient has no known allergies.  Family History  Problem Relation Age of Onset  .  Diabetes Mother   . Cancer Father     Social History Social History   Tobacco Use  . Smoking status: Current Every Day Smoker    Packs/day: 1.00    Types: Cigarettes  . Smokeless tobacco: Never Used  Substance Use Topics  . Alcohol use: No  . Drug use: No    Review of Systems Constitutional: No fever/chills Eyes: No visual changes. ENT: No sore throat. No stiff neck no neck pain Cardiovascular: Denies chest pain. Respiratory: Denies shortness of breath. Gastrointestinal:   no vomiting.  No diarrhea.  No  constipation. Genitourinary: Negative for dysuria. Musculoskeletal: Negative lower extremity swelling Skin: Negative for rash. Neurological: Negative for severe headaches, focal weakness or numbness.   ____________________________________________   PHYSICAL EXAM:  VITAL SIGNS: ED Triage Vitals  Enc Vitals Group     BP 02/20/19 1016 (!) 160/112     Pulse Rate 02/20/19 1016 83     Resp 02/20/19 1016 (!) 25     Temp 02/20/19 1016 98.3 F (36.8 C)     Temp Source 02/20/19 1016 Oral     SpO2 02/20/19 1016 100 %     Weight 02/20/19 1017 150 lb (68 kg)     Height 02/20/19 1017 6\' 2"  (1.88 m)     Head Circumference --      Peak Flow --      Pain Score --      Pain Loc --      Pain Edu? --      Excl. in [place]? --     Constitutional: Somnolent but easily arousable in no acute medical distress at this time.  Appears deconditioned. Eyes: Conjunctivae are normal Head: Oozing noted over the right eye. HEENT: No congestion/rhinnorhea. Mucous membranes are moist.  Oropharynx non-erythematous Neck:   Nontender with no meningismus, no masses, no stridor Cardiovascular: Normal rate, regular rhythm. Grossly normal heart sounds.  Good peripheral circulation. Respiratory: Normal respiratory effort.  No retractions. Lungs CTAB. Abdominal: Soft and nontender. No distention. No guarding no rebound Back:  There is no focal tenderness or step off.  there is no midline tenderness there are no lesions noted. there is no CVA tenderness Musculoskeletal: Patient states his right hip hurts but I can range it with no evidence of discomfort is not tender to palpate we will x-ray.  He also complains of tenderness to the left elbow.  There is minimal tenderness there.  There is range of motion noted.  Good distal pulses., no upper extremity tenderness. No joint effusions, no DVT signs strong distal pulses no edema Neurologic:  Normal speech and language. No gross focal neurologic deficits are appreciated.   Diffusely weak, will move all fours. Skin:  Skin is warm, dry and intact. No rash noted. Psychiatric: Mood and affect are normal. Speech and behavior are normal.  ____________________________________________   LABS (all labs ordered are listed, but only abnormal results are displayed)  Labs Reviewed  CK  COMPREHENSIVE METABOLIC PANEL  CBC WITH DIFFERENTIAL/PLATELET  URINALYSIS, COMPLETE (UACMP) WITH MICROSCOPIC  TROPONIN I (HIGH SENSITIVITY)    Pertinent labs  results that were available during my care of the patient were reviewed by me and considered in my medical decision making (see chart for details). ____________________________________________  EKG  I personally interpreted any EKGs ordered by me or triage Sinus rhythm rate 84 bpm no acute ST elevation, nonspecific ST changes normal axis ____________________________________________  RADIOLOGY  Pertinent labs & imaging results that were available during my care  of the patient were reviewed by me and considered in my medical decision making (see chart for details). If possible, patient and/or family made aware of any abnormal findings.  No results found. ____________________________________________    PROCEDURES  Procedure(s) performed: None  Procedures  Critical Care performed: None  ____________________________________________   INITIAL IMPRESSION / ASSESSMENT AND PLAN / ED COURSE  Pertinent labs & imaging results that were available during my care of the patient were reviewed by me and considered in my medical decision making (see chart for details).   Patient here with various pains and bruises after multiple falls.  He does not know if he passed out or not.  We will check total CK, work him up for possible syncopal event, we will keep and monitor we will obtain imaging of the areas that seem to hurt although no obvious fractures are detected on exam, we will CT his head and face and neck, and we will likely  need to admit him.    ____________________________________________   FINAL CLINICAL IMPRESSION(S) / ED DIAGNOSES  Final diagnoses:  Fall      This chart was dictated using voice recognition software.  Despite best efforts to proofread,  errors can occur which can change meaning.      Jeanmarie PlantMcShane, Galya Dunnigan A, MD 02/20/19 [phone]

## 2019-02-20 NOTE — ED Triage Notes (Addendum)
Pt arrived from home via EMS for report of fall sometime between yesterday and today - he was found naked laying face down in floor beside bed - pt has a sitter Mon-Fri that he sent home Tuesday and since then has fallen 3 times - he is c/o lt arm pain and right hip pain - pt noted to have hematoma above right eye and right eye appears to have yellow discharge

## 2019-02-20 NOTE — Progress Notes (Signed)
Patient's top dentures removed from mouth and placed in container on counter.  Mouth care provided.  Clarise Cruz, BSN

## 2019-02-20 NOTE — Progress Notes (Signed)
   02/20/19 1621  Vitals  Temp 98.5 F (36.9 C)  Temp Source Oral  BP (!) 187/97  MAP (mmHg) 123  BP Method Automatic  Pulse Rate 79  Pulse Rate Source Monitor  Resp 16  Oxygen Therapy  SpO2 100 %  Pain Assessment  Pain Scale PAINAD  PAINAD (Pain Assessment in Advanced Dementia)  Breathing 0  Negative Vocalization 0  Facial Expression 0  Body Language 0  Consolability 0  PAINAD Score 0  Height and Weight  Height 6\' 2"  (1.88 m)  Weight 60.1 kg  BSA (Calculated - sq m) 1.77 sq meters  BMI (Calculated) 17  Weight in (lb) to have BMI = 25 194.3  MEWS Score  MEWS RR 0  MEWS Pulse 0  MEWS Systolic 0  MEWS LOC 0  MEWS Temp 0  MEWS Score 0  MEWS Score Color Green  Donia Ast, NP aware of patient's current BP. New orders received.  Clarise Cruz, BSN

## 2019-02-20 NOTE — ED Notes (Signed)
ED TO INPATIENT HANDOFF REPORT  ED Nurse Name and Phone #:  Rosey Batheresa [phone]  [id number]  S Name/Age/Gender Stephen Mcknight 73 y.o. male Room/Bed: ED05A/ED05A  Code Status   Code Status: Full Code  Home/SNF/Other Home Patient oriented to: self Is this baseline? No   Triage Complete: Triage complete  Chief Complaint fall  Triage Note Pt arrived from home via EMS for report of fall sometime between yesterday and today - he was found naked laying face down in floor beside bed - pt has a sitter Mon-Fri that he sent home Tuesday and since then has fallen 3 times - he is c/o lt arm pain and right hip pain - pt noted to have hematoma above right eye and right eye appears to have yellow discharge    Allergies No Known Allergies  Level of Care/Admitting Diagnosis ED Disposition    ED Disposition Condition Comment   Admit  Hospital [facility] [[id number]]  Level of Care: Med-Surg [16]  Covid Evaluation: Confirmed COVID Negative  Diagnosis: Rhabdomyolysis [728.88.ICD-9-CM]  Admitting Physician: Webb SilversmithOUMA, ELIZABETH [facility] [[id number]]  Attending Physician: Webb SilversmithOUMA, ELIZABETH ACHIENG [[id number]]  Estimated length of stay: past midnight tomorrow  Certification:: I certify this patient will need inpatient services for at least 2 midnights  PT Class (Do Not Modify): Inpatient [101]  PT Acc Code (Do Not Modify): Private [1]       B Medical/Surgery History Past Medical History:  Diagnosis Date  . Hypertension   . Parkinson disease [facility](HCC)    Past Surgical History:  Procedure Laterality Date  . CHOLECYSTECTOMY       A IV Location/Drains/Wounds Patient Lines/Drains/Airways Status   Active Line/Drains/Airways    Name:   Placement date:   Placement time:   Site:   Days:   Peripheral IV 02/20/19 Right Wrist   02/20/19    1136    Wrist   less than 1          Intake/Output Last 24 hours  Intake/Output Summary (Last 24 hours) at 02/20/2019 1452 Last data filed at 02/20/2019  1429 Gross per 24 hour  Intake 500 ml  Output -  Net 500 ml    Labs/Imaging Results for orders placed or performed during the hospital encounter of 02/20/19 (from the past 48 hour(s))  CK     Status: Abnormal   Collection Time: 02/20/19 10:20 AM  Result Value Ref Range   Total CK 721 (H) 49 - 397 U/L    Comment: Performed at [facility], [address] [place], [place] [address]  Comprehensive metabolic panel     Status: Abnormal   Collection Time: 02/20/19 10:20 AM  Result Value Ref Range   Sodium 139 135 - 145 mmol/L   Potassium 3.9 3.5 - 5.1 mmol/L   Chloride 103 98 - 111 mmol/L   CO2 26 22 - 32 mmol/L   Glucose, Bld 140 (H) 70 - 99 mg/dL   BUN 32 (H) 8 - 23 mg/dL   Creatinine, Ser 7.250.97 0.61 - 1.24 mg/dL   Calcium 9.2 8.9 - 36.610.3 mg/dL   Total Protein 7.9 6.5 - 8.1 g/dL   Albumin 4.7 3.5 - 5.0 g/dL   AST 31 15 - 41 U/L   ALT 30 0 - 44 U/L   Alkaline Phosphatase 51 38 - 126 U/L   Total Bilirubin 0.7 0.3 - 1.2 mg/dL   GFR calc non Af Amer >60 >60 mL/min   GFR calc Af Amer >60 >60 mL/min  Anion gap 10 5 - 15    Comment: Performed at [facility], [address] [place], [place] [address]  CBC with Differential     Status: Abnormal   Collection Time: 02/20/19 10:20 AM  Result Value Ref Range   WBC 12.7 (H) 4.0 - 10.5 K/uL   RBC 4.15 (L) 4.22 - 5.81 MIL/uL   Hemoglobin 12.7 (L) 13.0 - 17.0 g/dL   HCT 60.4 (L) 54.0 - 98.1 %   MCV 89.2 80.0 - 100.0 fL   MCH 30.6 26.0 - 34.0 pg   MCHC 34.3 30.0 - 36.0 g/dL   RDW 19.1 47.8 - 29.5 %   Platelets 209 150 - 400 K/uL   nRBC 0.0 0.0 - 0.2 %   Neutrophils Relative % 93 %   Neutro Abs 11.7 (H) 1.7 - 7.7 K/uL   Lymphocytes Relative 4 %   Lymphs Abs 0.5 (L) 0.7 - 4.0 K/uL   Monocytes Relative 3 %   Monocytes Absolute 0.4 0.1 - 1.0 K/uL   Eosinophils Relative 0 %   Eosinophils Absolute 0.0 0.0 - 0.5 K/uL   Basophils Relative 0 %   Basophils Absolute 0.0 0.0 - 0.1 K/uL   Immature Granulocytes 0 %   Abs  Immature Granulocytes 0.04 0.00 - 0.07 K/uL    Comment: Performed at [facility], [address] [place], [place] [address]  Troponin I (High Sensitivity)     Status: Abnormal   Collection Time: 02/20/19 10:20 AM  Result Value Ref Range   Troponin I (High Sensitivity) 65 (H) <18 ng/L    Comment: (NOTE) Elevated high sensitivity troponin I (hsTnI) values and significant  changes across serial measurements may suggest ACS but many other  chronic and acute conditions are known to elevate hsTnI results.  Refer to the "Links" section for chest pain algorithms and additional  guidance. Performed at [facility], [address] [place], [place] [address]   Urinalysis, Complete w Microscopic     Status: Abnormal   Collection Time: 02/20/19 11:35 AM  Result Value Ref Range   Color, Urine YELLOW (A) YELLOW   APPearance HAZY (A) CLEAR   Specific Gravity, Urine 1.023 1.005 - 1.030   pH 5.0 5.0 - 8.0   Glucose, UA NEGATIVE NEGATIVE mg/dL   Hgb urine dipstick LARGE (A) NEGATIVE   Bilirubin Urine NEGATIVE NEGATIVE   Ketones, ur 20 (A) NEGATIVE mg/dL   Protein, ur 469 (A) NEGATIVE mg/dL   Nitrite NEGATIVE NEGATIVE   Leukocytes,Ua NEGATIVE NEGATIVE   RBC / HPF 0-5 0 - 5 RBC/hpf   WBC, UA 0-5 0 - 5 WBC/hpf   Bacteria, UA NONE SEEN NONE SEEN   Squamous Epithelial / LPF 0-5 0 - 5   Mucus PRESENT     Comment: Performed at [facility], [address], [place], [place] [address]  SARS Coronavirus 2 (CEPHEID - Performed in [facility] hospital lab), Hosp Order     Status: None   Collection Time: 02/20/19 11:35 AM   Specimen: Urine, Catheterized; Nasopharyngeal  Result Value Ref Range   SARS Coronavirus 2 NEGATIVE NEGATIVE    Comment: (NOTE) If result is NEGATIVE SARS-CoV-2 target nucleic acids are NOT DETECTED. The SARS-CoV-2 RNA is generally detectable in upper and lower  respiratory specimens during the acute phase of infection. The lowest  concentration of  SARS-CoV-2 viral copies this assay can detect is 250  copies / mL. A negative result does not preclude SARS-CoV-2 infection  and should not be used as the sole basis for treatment or other  patient management decisions.  A negative result may occur with  improper specimen collection / handling, submission of specimen other  than nasopharyngeal swab, presence of viral mutation(s) within the  areas targeted by this assay, and inadequate number of viral copies  (<250 copies / mL). A negative result must be combined with clinical  observations, patient history, and epidemiological information. If result is POSITIVE SARS-CoV-2 target nucleic acids are DETECTED. The SARS-CoV-2 RNA is generally detectable in upper and lower  respiratory specimens dur ing the acute phase of infection.  Positive  results are indicative of active infection with SARS-CoV-2.  Clinical  correlation with patient history and other diagnostic information is  necessary to determine patient infection status.  Positive results do  not rule out bacterial infection or co-infection with other viruses. If result is PRESUMPTIVE POSTIVE SARS-CoV-2 nucleic acids MAY BE PRESENT.   A presumptive positive result was obtained on the submitted specimen  and confirmed on repeat testing.  While 2019 novel coronavirus  (SARS-CoV-2) nucleic acids may be present in the submitted sample  additional confirmatory testing may be necessary for epidemiological  and / or clinical management purposes  to differentiate between  SARS-CoV-2 and other Sarbecovirus currently known to infect humans.  If clinically indicated additional testing with an alternate test  methodology [phone]([id number]) is advised. The SARS-CoV-2 RNA is generally  detectable in upper and lower respiratory sp ecimens during the acute  phase of infection. The expected result is Negative. Fact Sheet for Patients:  BoilerBrush.com.cyhttps://www.fda.gov/media/136312/download Fact Sheet for Healthcare  Providers: https://pope.com/https://www.fda.gov/media/136313/download This test is not yet approved or cleared by the [place] FDA and has been authorized for detection and/or diagnosis of SARS-CoV-2 by FDA under an Emergency Use Authorization (EUA).  This EUA will remain in effect (meaning this test can be used) for the duration of the COVID-19 declaration under Section 564(b)(1) of the Act, 21 U.S.C. section 360bbb-3(b)(1), unless the authorization is terminated or revoked sooner. Performed at [facility], [address] [place], [place] [address]    Ct Head Wo Contrast  Result Date: 02/20/2019 CLINICAL DATA:  Pt arrived from home via EMS for report of fall sometime between yesterday and today - he was found naked laying face down in floor beside bed - pt has a sitter Mon-Fri that he sent home Tuesday and since then has fallen 3 times - he is c/o lt arm pain and right hip pain - pt noted to have hematoma above right eye and right eye appears to have yellow discharge EXAM: CT HEAD WITHOUT CONTRAST CT MAXILLOFACIAL WITHOUT CONTRAST CT CERVICAL SPINE WITHOUT CONTRAST TECHNIQUE: Multidetector CT imaging of the head, cervical spine, and maxillofacial structures were performed using the standard protocol without intravenous contrast. Multiplanar CT image reconstructions of the cervical spine and maxillofacial structures were also generated. COMPARISON:  Head CT, 06/28/2018.  Cervical spine CT, 01/18/2017. FINDINGS: CT HEAD FINDINGS Brain: No evidence of acute infarction, hemorrhage, hydrocephalus, extra-axial collection or mass lesion/mass effect. Vascular: No hyperdense vessel or unexpected calcification. Skull: Normal. Negative for fracture or focal lesion. Other: Right inferior frontal scalp hematoma extending to the periorbital soft tissues. CT MAXILLOFACIAL FINDINGS Osseous: No fracture or bone lesion. Orbits: There is preseptal right periorbital soft tissue swelling/hemorrhage extending to the  periorbital soft tissues, and along the right cheek and lower right forehead. No injury to the right globe or postseptal orbit. Left globe and orbit are  unremarkable. Sinuses: Clear sinuses, mastoid air cells and middle ear cavities. Soft tissues: No soft tissue masses.  No adenopathy. CT CERVICAL SPINE FINDINGS Alignment: Normal. Skull base and vertebrae: No acute fracture. No primary bone lesion or focal pathologic process. Soft tissues and spinal canal: No prevertebral fluid or swelling. No visible canal hematoma. No soft tissue masses or adenopathy. Disc levels: Moderate loss of disc height at C5-C6 and C6-C7 with spondylotic disc bulging and endplate spurring. Facet degenerative changes are noted most evident the left at C2-C3 and C4-C5. No convincing disc herniation. Upper chest: No acute findings. Chronic scarring at the lung apices. Other: None. IMPRESSION: HEAD CT 1. No intracranial abnormality. 2. No skull fracture. MAXILLOFACIAL CT 1. Right periorbital soft tissue swelling/hematoma extending to the inferior right forehead and right cheek. No injury to the right globe or postseptal orbit. No other acute finding. 2. No fractures. CERVICAL CT 1. No fracture or acute finding. Electronically Signed   By: Lajean Manes M.D.   On: 02/20/2019 11:35   Ct Cervical Spine Wo Contrast  Result Date: 02/20/2019 CLINICAL DATA:  Pt arrived from home via EMS for report of fall sometime between yesterday and today - he was found naked laying face down in floor beside bed - pt has a sitter Mon-Fri that he sent home Tuesday and since then has fallen 3 times - he is c/o lt arm pain and right hip pain - pt noted to have hematoma above right eye and right eye appears to have yellow discharge EXAM: CT HEAD WITHOUT CONTRAST CT MAXILLOFACIAL WITHOUT CONTRAST CT CERVICAL SPINE WITHOUT CONTRAST TECHNIQUE: Multidetector CT imaging of the head, cervical spine, and maxillofacial structures were performed using the standard protocol  without intravenous contrast. Multiplanar CT image reconstructions of the cervical spine and maxillofacial structures were also generated. COMPARISON:  Head CT, 06/28/2018.  Cervical spine CT, 01/18/2017. FINDINGS: CT HEAD FINDINGS Brain: No evidence of acute infarction, hemorrhage, hydrocephalus, extra-axial collection or mass lesion/mass effect. Vascular: No hyperdense vessel or unexpected calcification. Skull: Normal. Negative for fracture or focal lesion. Other: Right inferior frontal scalp hematoma extending to the periorbital soft tissues. CT MAXILLOFACIAL FINDINGS Osseous: No fracture or bone lesion. Orbits: There is preseptal right periorbital soft tissue swelling/hemorrhage extending to the periorbital soft tissues, and along the right cheek and lower right forehead. No injury to the right globe or postseptal orbit. Left globe and orbit are unremarkable. Sinuses: Clear sinuses, mastoid air cells and middle ear cavities. Soft tissues: No soft tissue masses.  No adenopathy. CT CERVICAL SPINE FINDINGS Alignment: Normal. Skull base and vertebrae: No acute fracture. No primary bone lesion or focal pathologic process. Soft tissues and spinal canal: No prevertebral fluid or swelling. No visible canal hematoma. No soft tissue masses or adenopathy. Disc levels: Moderate loss of disc height at C5-C6 and C6-C7 with spondylotic disc bulging and endplate spurring. Facet degenerative changes are noted most evident the left at C2-C3 and C4-C5. No convincing disc herniation. Upper chest: No acute findings. Chronic scarring at the lung apices. Other: None. IMPRESSION: HEAD CT 1. No intracranial abnormality. 2. No skull fracture. MAXILLOFACIAL CT 1. Right periorbital soft tissue swelling/hematoma extending to the inferior right forehead and right cheek. No injury to the right globe or postseptal orbit. No other acute finding. 2. No fractures. CERVICAL CT 1. No fracture or acute finding. Electronically Signed   By: Lajean Manes M.D.   On: 02/20/2019 11:35   Dg Chest Port 1 View  Result Date:  02/20/2019 CLINICAL DATA:  Pt arrived from home via EMS for report of fall sometime between yesterday and today - he was found naked laying face down in floor beside bed - pt has a sitter Mon-Fri that he sent home Tuesday and since then has fallen 3 times - he is c/o lt arm pain and right hip pain - pt noted to have hematoma above right eye and right eye appears to have yellow discharge EXAM: PORTABLE CHEST 1 VIEW COMPARISON:  06/28/2018 FINDINGS: Cardiac silhouette is normal in size. No mediastinal or hilar masses. There is no evidence of adenopathy. Clear lungs.  No pleural effusion or pneumothorax. Skeletal structures are demineralized but grossly intact. IMPRESSION: No active disease. Electronically Signed   By: Amie Portlandavid  Ormond M.D.   On: 02/20/2019 11:25   Dg Humerus Left  Result Date: 02/20/2019 CLINICAL DATA:  Pt arrived from home via EMS for report of fall sometime between yesterday and today - he was found naked laying face down in floor beside bed - pt has a sitter Mon-Fri that he sent home Tuesday and since then has fallen 3 times - he is c/o lt arm pain and right hip pain - pt noted to have hematoma above right eye and right eye appears to have yellow discharge EXAM: LEFT HUMERUS - 2+ VIEW COMPARISON:  None. FINDINGS: No fracture or bone lesion. Surgical screw in the superior aspect the greater tuberosity of the proximal humerus consistent with previous rotator repair. Glenohumeral and elbow joint normally spaced and aligned. Small metal foreign body projects in the superficial soft tissues of the distal arm anteriorly. IMPRESSION: 1. No fracture or dislocation. Electronically Signed   By: Amie Portlandavid  Ormond M.D.   On: 02/20/2019 11:25   Dg Hip Unilat W Or Wo Pelvis 2-3 Views Right  Result Date: 02/20/2019 CLINICAL DATA:  Pt arrived from home via EMS for report of fall sometime between yesterday and today - he was found naked  laying face down in floor beside bed - pt has a sitter Mon-Fri that he sent home Tuesday and since then has fallen 3 times - he is c/o lt arm pain and right hip pain - pt noted to have hematoma above right eye and right eye appears to have yellow discharge EXAM: DG HIP (WITH OR WITHOUT PELVIS) 2-3V RIGHT COMPARISON:  None. FINDINGS: No fracture or bone lesion. Hip joints, SI joints and symphysis pubis are normally spaced and aligned. Tissue shows scattered arterial vascular calcifications but are otherwise unremarkable. IMPRESSION: No fracture or dislocation Electronically Signed   By: Amie Portlandavid  Ormond M.D.   On: 02/20/2019 11:23   Ct Maxillofacial Wo Contrast  Result Date: 02/20/2019 CLINICAL DATA:  Pt arrived from home via EMS for report of fall sometime between yesterday and today - he was found naked laying face down in floor beside bed - pt has a sitter Mon-Fri that he sent home Tuesday and since then has fallen 3 times - he is c/o lt arm pain and right hip pain - pt noted to have hematoma above right eye and right eye appears to have yellow discharge EXAM: CT HEAD WITHOUT CONTRAST CT MAXILLOFACIAL WITHOUT CONTRAST CT CERVICAL SPINE WITHOUT CONTRAST TECHNIQUE: Multidetector CT imaging of the head, cervical spine, and maxillofacial structures were performed using the standard protocol without intravenous contrast. Multiplanar CT image reconstructions of the cervical spine and maxillofacial structures were also generated. COMPARISON:  Head CT, 06/28/2018.  Cervical spine CT, 01/18/2017. FINDINGS: CT HEAD FINDINGS Brain: No  evidence of acute infarction, hemorrhage, hydrocephalus, extra-axial collection or mass lesion/mass effect. Vascular: No hyperdense vessel or unexpected calcification. Skull: Normal. Negative for fracture or focal lesion. Other: Right inferior frontal scalp hematoma extending to the periorbital soft tissues. CT MAXILLOFACIAL FINDINGS Osseous: No fracture or bone lesion. Orbits: There is  preseptal right periorbital soft tissue swelling/hemorrhage extending to the periorbital soft tissues, and along the right cheek and lower right forehead. No injury to the right globe or postseptal orbit. Left globe and orbit are unremarkable. Sinuses: Clear sinuses, mastoid air cells and middle ear cavities. Soft tissues: No soft tissue masses.  No adenopathy. CT CERVICAL SPINE FINDINGS Alignment: Normal. Skull base and vertebrae: No acute fracture. No primary bone lesion or focal pathologic process. Soft tissues and spinal canal: No prevertebral fluid or swelling. No visible canal hematoma. No soft tissue masses or adenopathy. Disc levels: Moderate loss of disc height at C5-C6 and C6-C7 with spondylotic disc bulging and endplate spurring. Facet degenerative changes are noted most evident the left at C2-C3 and C4-C5. No convincing disc herniation. Upper chest: No acute findings. Chronic scarring at the lung apices. Other: None. IMPRESSION: HEAD CT 1. No intracranial abnormality. 2. No skull fracture. MAXILLOFACIAL CT 1. Right periorbital soft tissue swelling/hematoma extending to the inferior right forehead and right cheek. No injury to the right globe or postseptal orbit. No other acute finding. 2. No fractures. CERVICAL CT 1. No fracture or acute finding. Electronically Signed   By: Amie Portland M.D.   On: 02/20/2019 11:35    Pending Labs Unresulted Labs (From admission, onward)   None      Vitals/Pain Today's Vitals   02/20/19 1200 02/20/19 1215 02/20/19 1230 02/20/19 1330  BP: (!) 166/94  (!) 174/92 (!) 164/88  Pulse:  81 73 68  Resp:  12    Temp:      TempSrc:      SpO2:  100% 100% 99%  Weight:      Height:        Isolation Precautions No active isolations  Medications Medications  aspirin EC tablet 81 mg (has no administration in time range)  docusate sodium (COLACE) capsule 100 mg (has no administration in time range)  mirtazapine (REMERON) tablet 15 mg (has no administration in  time range)  pantoprazole (PROTONIX) EC tablet 40 mg (has no administration in time range)  oxybutynin (DITROPAN) tablet 5 mg (has no administration in time range)  carbidopa-levodopa (SINEMET IR) 25-100 MG per tablet immediate release 2 tablet (has no administration in time range)  methocarbamol (ROBAXIN) tablet 1,000 mg (has no administration in time range)  pramipexole (MIRAPEX) tablet 0.5 mg (has no administration in time range)  enoxaparin (LOVENOX) injection 40 mg (has no administration in time range)  0.9 %  sodium chloride infusion (has no administration in time range)  multivitamin with minerals tablet 1 tablet (has no administration in time range)  sodium chloride 0.9 % bolus 500 mL (0 mLs Intravenous Stopped 02/20/19 1429)    Mobility non-ambulatory High fall risk   Focused Assessments See previous assessment   R Recommendations: See Admitting Provider Note  Report given to:   Additional Notes:  Pt is from home with sitter Monday through Friday. At this time, responsive to stimuli but unable to verbalize needs. Not alert enough to take medications PO.

## 2019-02-20 NOTE — ED Notes (Signed)
Attempted to call report - was advised that they would return call

## 2019-02-20 NOTE — ED Notes (Signed)
Pt is not arousable to the point that this nurse is comfortable placing anything in his mouth including food, drink, medicaitions - will message admitting provider to make aware

## 2019-02-20 NOTE — ED Notes (Signed)
Patient transported to CT 

## 2019-02-20 NOTE — ED Notes (Signed)
Per admitting provider to hold all medication until pt is awake

## 2019-02-20 NOTE — ED Notes (Signed)
Pt daughter/ [facility] Ar[place] Go [phone]

## 2019-02-21 ENCOUNTER — Inpatient Hospital Stay: Payer: Medicare Other

## 2019-02-21 ENCOUNTER — Inpatient Hospital Stay
Admit: 2019-02-21 | Discharge: 2019-02-21 | Disposition: A | Discharge status: Home / Self Care | Payer: Medicare Other | Attending: Nurse Practitioner | Admitting: Nurse Practitioner

## 2019-02-21 DIAGNOSIS — R55 Syncope and collapse: Secondary | ICD-10-CM

## 2019-02-21 LAB — BASIC METABOLIC PANEL
Anion gap: 7 (ref 5–15)
BUN: 22 mg/dL (ref 8–23)
CO2: 25 mmol/L (ref 22–32)
Calcium: 8.4 mg/dL — ABNORMAL LOW (ref 8.9–10.3)
Chloride: 108 mmol/L (ref 98–111)
Creatinine, Ser: 0.79 mg/dL (ref 0.61–1.24)
GFR calc Af Amer: >60 mL/min (ref >60)
GFR calc non Af Amer: >60 mL/min (ref >60)
Glucose, Bld: 96 mg/dL (ref 70–99)
Potassium: 3.7 mmol/L (ref 3.5–5.1)
Sodium: 140 mmol/L (ref 135–145)

## 2019-02-21 LAB — CBC WITH DIFFERENTIAL/PLATELET
Abs Immature Granulocytes: 0.04 10*3/uL (ref 0.00–0.07)
Basophils Absolute: 0 10*3/uL (ref 0.0–0.1)
Basophils Relative: 0 %
Eosinophils Absolute: 0 10*3/uL (ref 0.0–0.5)
Eosinophils Relative: 0 %
HCT: 34.5 % — ABNORMAL LOW (ref 39.0–52.0)
Hemoglobin: 11.7 g/dL — ABNORMAL LOW (ref 13.0–17.0)
Immature Granulocytes: 0 %
Lymphocytes Relative: 22 %
Lymphs Abs: 2.2 10*3/uL (ref 0.7–4.0)
MCH: 30.5 pg (ref 26.0–34.0)
MCHC: 33.9 g/dL (ref 30.0–36.0)
MCV: 90.1 fL (ref 80.0–100.0)
Monocytes Absolute: 0.7 10*3/uL (ref 0.1–1.0)
Monocytes Relative: 8 %
Neutro Abs: 6.8 10*3/uL (ref 1.7–7.7)
Neutrophils Relative %: 70 %
Platelets: 189 10*3/uL (ref 150–400)
RBC: 3.83 MIL/uL — ABNORMAL LOW (ref 4.22–5.81)
RDW: 12.1 % (ref 11.5–15.5)
WBC: 9.8 10*3/uL (ref 4.0–10.5)
nRBC: 0 % (ref 0.0–0.2)

## 2019-02-21 LAB — ECHOCARDIOGRAM COMPLETE
Height: 74 in
Weight: 2119.94 oz

## 2019-02-21 LAB — MAGNESIUM: Magnesium: 2 mg/dL (ref 1.7–2.4)

## 2019-02-21 LAB — CK: Total CK: 976 U/L — ABNORMAL HIGH (ref 49–397)

## 2019-02-21 MED ORDER — HYDRALAZINE HCL 20 MG/ML IJ SOLN
10.0000 mg | Freq: Four times a day (QID) | INTRAMUSCULAR | Status: DC | PRN
  Administered 2019-02-21: 10 mg via INTRAVENOUS
  Filled 2019-02-21: qty 1

## 2019-02-21 MED ORDER — ORAL CARE MOUTH RINSE
15.0000 mL | Freq: Two times a day (BID) | OROMUCOSAL | Status: DC
  Administered 2019-02-21 – 2019-02-23 (×5): 15 mL via OROMUCOSAL

## 2019-02-21 MED ORDER — ENSURE ENLIVE PO LIQD
237.0000 mL | Freq: Two times a day (BID) | ORAL | Status: DC
  Administered 2019-02-21 – 2019-02-24 (×6): 237 mL via ORAL

## 2019-02-21 MED ORDER — LABETALOL HCL 5 MG/ML IV SOLN
10.0000 mg | Freq: Four times a day (QID) | INTRAVENOUS | Status: DC | PRN
  Administered 2019-02-21: 10 mg via INTRAVENOUS
  Filled 2019-02-21: qty 4

## 2019-02-21 MED ORDER — AMLODIPINE BESYLATE 10 MG PO TABS
10.0000 mg | ORAL_TABLET | Freq: Every day | ORAL | Status: DC
  Administered 2019-02-22 – 2019-02-24 (×3): 10 mg via ORAL
  Filled 2019-02-21 (×3): qty 1

## 2019-02-21 NOTE — Progress Notes (Signed)
[facility] [place] [facility]   PATIENT NAME: Stephen FieldCarl Mcknight    MR#:  [id number]  DATE OF BIRTH:  February 20, 1946  SUBJECTIVE:  Patient found down in his house by patient's neighbor. Patient more alert this morning and is able to respond to my questions Follow some commands   REVIEW OF SYSTEMS:  Limited review of systems due to some confusion Review of Systems  Constitutional: Negative for fever, chills weight loss    Respiratory: Denies cough, shortness of breath, wheezing  Cardiovascular: Negative for chest pain, palpitations and leg swelling.  Gastrointestinal: Negative for heartburn, abdominal pain, vomiting, diarrhea or consitpation Musculoskeletal: Negative for back pain or joint pain Neurological:   Positive for Parkinson's Some lethargy this morning Psychiatric/Behavioral: Positive confusion    Tolerating Diet: yes      DRUG ALLERGIES:  No Known Allergies  VITALS:  Blood pressure (!) 204/108, pulse 65, temperature 98 F (36.7 C), temperature source Oral, resp. rate 18, height 6\' 2"  (1.88 m), weight 60.1 kg, SpO2 100 %.  PHYSICAL EXAMINATION:  Constitutional: Appears with parkinsonian no distress. HENT: Normocephalic. Marland Kitchen. Oropharynx is clear and moist.  Eyes: Conjunctivae and EOM are normal. PERRLA, no scleral icterus.  Neck: Normal ROM. Neck supple. No JVD. No tracheal deviation. CVS: RRR, S1/S2 +, no murmurs, no gallops, no carotid bruit.  Pulmonary: Effort and breath sounds normal, no stridor, rhonchi, wheezes, rales.  Abdominal: Soft. BS +,  no distension, tenderness, rebound or guarding.  Musculoskeletal: No edema  He has pill-rolling tremor   neuro: He is alert and will respond to some of my commands however at times he becomes lethargic Skin: Skin is warm and dry. No rash noted. Psychiatric: Confused but is oriented to name and place not time     LABORATORY PANEL:   CBC Recent Labs  Lab 02/21/19 0425  WBC 9.8  HGB 11.7*   HCT 34.5*  PLT 189   ------------------------------------------------------------------------------------------------------------------  Chemistries  Recent Labs  Lab 02/20/19 1020 02/21/19 0425  NA 139 140  K 3.9 3.7  CL 103 108  CO2 26 25  GLUCOSE 140* 96  BUN 32* 22  CREATININE 0.97 0.79  CALCIUM 9.2 8.4*  MG  --  2.0  AST 31  --   ALT 30  --   ALKPHOS 51  --   BILITOT 0.7  --    ------------------------------------------------------------------------------------------------------------------  Cardiac Enzymes No results for input(s): TROPONINI in the last 168 hours. ------------------------------------------------------------------------------------------------------------------  RADIOLOGY:  Ct Head Wo Contrast  Result Date: 02/20/2019 CLINICAL DATA:  Pt arrived from home via EMS for report of fall sometime between yesterday and today - he was found naked laying face down in floor beside bed - pt has a sitter Mon-Fri that he sent home Tuesday and since then has fallen 3 times - he is c/o lt arm pain and right hip pain - pt noted to have hematoma above right eye and right eye appears to have yellow discharge EXAM: CT HEAD WITHOUT CONTRAST CT MAXILLOFACIAL WITHOUT CONTRAST CT CERVICAL SPINE WITHOUT CONTRAST TECHNIQUE: Multidetector CT imaging of the head, cervical spine, and maxillofacial structures were performed using the standard protocol without intravenous contrast. Multiplanar CT image reconstructions of the cervical spine and maxillofacial structures were also generated. COMPARISON:  Head CT, 06/28/2018.  Cervical spine CT, 01/18/2017. FINDINGS: CT HEAD FINDINGS Brain: No evidence of acute infarction, hemorrhage, hydrocephalus, extra-axial collection or mass lesion/mass effect. Vascular: No hyperdense vessel or unexpected calcification. Skull: Normal. Negative for fracture  or focal lesion. Other: Right inferior frontal scalp hematoma extending to the periorbital soft  tissues. CT MAXILLOFACIAL FINDINGS Osseous: No fracture or bone lesion. Orbits: There is preseptal right periorbital soft tissue swelling/hemorrhage extending to the periorbital soft tissues, and along the right cheek and lower right forehead. No injury to the right globe or postseptal orbit. Left globe and orbit are unremarkable. Sinuses: Clear sinuses, mastoid air cells and middle ear cavities. Soft tissues: No soft tissue masses.  No adenopathy. CT CERVICAL SPINE FINDINGS Alignment: Normal. Skull base and vertebrae: No acute fracture. No primary bone lesion or focal pathologic process. Soft tissues and spinal canal: No prevertebral fluid or swelling. No visible canal hematoma. No soft tissue masses or adenopathy. Disc levels: Moderate loss of disc height at C5-C6 and C6-C7 with spondylotic disc bulging and endplate spurring. Facet degenerative changes are noted most evident the left at C2-C3 and C4-C5. No convincing disc herniation. Upper chest: No acute findings. Chronic scarring at the lung apices. Other: None. IMPRESSION: HEAD CT 1. No intracranial abnormality. 2. No skull fracture. MAXILLOFACIAL CT 1. Right periorbital soft tissue swelling/hematoma extending to the inferior right forehead and right cheek. No injury to the right globe or postseptal orbit. No other acute finding. 2. No fractures. CERVICAL CT 1. No fracture or acute finding. Electronically Signed   By: Lajean Manes M.D.   On: 02/20/2019 11:35   Ct Cervical Spine Wo Contrast  Result Date: 02/20/2019 CLINICAL DATA:  Pt arrived from home via EMS for report of fall sometime between yesterday and today - he was found naked laying face down in floor beside bed - pt has a sitter Mon-Fri that he sent home Tuesday and since then has fallen 3 times - he is c/o lt arm pain and right hip pain - pt noted to have hematoma above right eye and right eye appears to have yellow discharge EXAM: CT HEAD WITHOUT CONTRAST CT MAXILLOFACIAL WITHOUT CONTRAST CT  CERVICAL SPINE WITHOUT CONTRAST TECHNIQUE: Multidetector CT imaging of the head, cervical spine, and maxillofacial structures were performed using the standard protocol without intravenous contrast. Multiplanar CT image reconstructions of the cervical spine and maxillofacial structures were also generated. COMPARISON:  Head CT, 06/28/2018.  Cervical spine CT, 01/18/2017. FINDINGS: CT HEAD FINDINGS Brain: No evidence of acute infarction, hemorrhage, hydrocephalus, extra-axial collection or mass lesion/mass effect. Vascular: No hyperdense vessel or unexpected calcification. Skull: Normal. Negative for fracture or focal lesion. Other: Right inferior frontal scalp hematoma extending to the periorbital soft tissues. CT MAXILLOFACIAL FINDINGS Osseous: No fracture or bone lesion. Orbits: There is preseptal right periorbital soft tissue swelling/hemorrhage extending to the periorbital soft tissues, and along the right cheek and lower right forehead. No injury to the right globe or postseptal orbit. Left globe and orbit are unremarkable. Sinuses: Clear sinuses, mastoid air cells and middle ear cavities. Soft tissues: No soft tissue masses.  No adenopathy. CT CERVICAL SPINE FINDINGS Alignment: Normal. Skull base and vertebrae: No acute fracture. No primary bone lesion or focal pathologic process. Soft tissues and spinal canal: No prevertebral fluid or swelling. No visible canal hematoma. No soft tissue masses or adenopathy. Disc levels: Moderate loss of disc height at C5-C6 and C6-C7 with spondylotic disc bulging and endplate spurring. Facet degenerative changes are noted most evident the left at C2-C3 and C4-C5. No convincing disc herniation. Upper chest: No acute findings. Chronic scarring at the lung apices. Other: None. IMPRESSION: HEAD CT 1. No intracranial abnormality. 2. No skull fracture. MAXILLOFACIAL CT 1.  Right periorbital soft tissue swelling/hematoma extending to the inferior right forehead and right cheek. No  injury to the right globe or postseptal orbit. No other acute finding. 2. No fractures. CERVICAL CT 1. No fracture or acute finding. Electronically Signed   By: Amie Portlandavid  Ormond M.D.   On: 02/20/2019 11:35   Dg Chest Port 1 View  Result Date: 02/20/2019 CLINICAL DATA:  Pt arrived from home via EMS for report of fall sometime between yesterday and today - he was found naked laying face down in floor beside bed - pt has a sitter Mon-Fri that he sent home Tuesday and since then has fallen 3 times - he is c/o lt arm pain and right hip pain - pt noted to have hematoma above right eye and right eye appears to have yellow discharge EXAM: PORTABLE CHEST 1 VIEW COMPARISON:  06/28/2018 FINDINGS: Cardiac silhouette is normal in size. No mediastinal or hilar masses. There is no evidence of adenopathy. Clear lungs.  No pleural effusion or pneumothorax. Skeletal structures are demineralized but grossly intact. IMPRESSION: No active disease. Electronically Signed   By: Amie Portlandavid  Ormond M.D.   On: 02/20/2019 11:25   Dg Humerus Left  Result Date: 02/20/2019 CLINICAL DATA:  Pt arrived from home via EMS for report of fall sometime between yesterday and today - he was found naked laying face down in floor beside bed - pt has a sitter Mon-Fri that he sent home Tuesday and since then has fallen 3 times - he is c/o lt arm pain and right hip pain - pt noted to have hematoma above right eye and right eye appears to have yellow discharge EXAM: LEFT HUMERUS - 2+ VIEW COMPARISON:  None. FINDINGS: No fracture or bone lesion. Surgical screw in the superior aspect the greater tuberosity of the proximal humerus consistent with previous rotator repair. Glenohumeral and elbow joint normally spaced and aligned. Small metal foreign body projects in the superficial soft tissues of the distal arm anteriorly. IMPRESSION: 1. No fracture or dislocation. Electronically Signed   By: Amie Portlandavid  Ormond M.D.   On: 02/20/2019 11:25   Dg Hip Unilat W Or Wo Pelvis  2-3 Views Right  Result Date: 02/20/2019 CLINICAL DATA:  Pt arrived from home via EMS for report of fall sometime between yesterday and today - he was found naked laying face down in floor beside bed - pt has a sitter Mon-Fri that he sent home Tuesday and since then has fallen 3 times - he is c/o lt arm pain and right hip pain - pt noted to have hematoma above right eye and right eye appears to have yellow discharge EXAM: DG HIP (WITH OR WITHOUT PELVIS) 2-3V RIGHT COMPARISON:  None. FINDINGS: No fracture or bone lesion. Hip joints, SI joints and symphysis pubis are normally spaced and aligned. Tissue shows scattered arterial vascular calcifications but are otherwise unremarkable. IMPRESSION: No fracture or dislocation Electronically Signed   By: Amie Portlandavid  Ormond M.D.   On: 02/20/2019 11:23   Ct Maxillofacial Wo Contrast  Result Date: 02/20/2019 CLINICAL DATA:  Pt arrived from home via EMS for report of fall sometime between yesterday and today - he was found naked laying face down in floor beside bed - pt has a sitter Mon-Fri that he sent home Tuesday and since then has fallen 3 times - he is c/o lt arm pain and right hip pain - pt noted to have hematoma above right eye and right eye appears to have yellow discharge EXAM: CT  HEAD WITHOUT CONTRAST CT MAXILLOFACIAL WITHOUT CONTRAST CT CERVICAL SPINE WITHOUT CONTRAST TECHNIQUE: Multidetector CT imaging of the head, cervical spine, and maxillofacial structures were performed using the standard protocol without intravenous contrast. Multiplanar CT image reconstructions of the cervical spine and maxillofacial structures were also generated. COMPARISON:  Head CT, 06/28/2018.  Cervical spine CT, 01/18/2017. FINDINGS: CT HEAD FINDINGS Brain: No evidence of acute infarction, hemorrhage, hydrocephalus, extra-axial collection or mass lesion/mass effect. Vascular: No hyperdense vessel or unexpected calcification. Skull: Normal. Negative for fracture or focal lesion. Other:  Right inferior frontal scalp hematoma extending to the periorbital soft tissues. CT MAXILLOFACIAL FINDINGS Osseous: No fracture or bone lesion. Orbits: There is preseptal right periorbital soft tissue swelling/hemorrhage extending to the periorbital soft tissues, and along the right cheek and lower right forehead. No injury to the right globe or postseptal orbit. Left globe and orbit are unremarkable. Sinuses: Clear sinuses, mastoid air cells and middle ear cavities. Soft tissues: No soft tissue masses.  No adenopathy. CT CERVICAL SPINE FINDINGS Alignment: Normal. Skull base and vertebrae: No acute fracture. No primary bone lesion or focal pathologic process. Soft tissues and spinal canal: No prevertebral fluid or swelling. No visible canal hematoma. No soft tissue masses or adenopathy. Disc levels: Moderate loss of disc height at C5-C6 and C6-C7 with spondylotic disc bulging and endplate spurring. Facet degenerative changes are noted most evident the left at C2-C3 and C4-C5. No convincing disc herniation. Upper chest: No acute findings. Chronic scarring at the lung apices. Other: None. IMPRESSION: HEAD CT 1. No intracranial abnormality. 2. No skull fracture. MAXILLOFACIAL CT 1. Right periorbital soft tissue swelling/hematoma extending to the inferior right forehead and right cheek. No injury to the right globe or postseptal orbit. No other acute finding. 2. No fractures. CERVICAL CT 1. No fracture or acute finding. Electronically Signed   By: Amie Portlandavid  Ormond M.D.   On: 02/20/2019 11:35     ASSESSMENT AND PLAN:   73 year old male with history of Parkinson's disease who presents to the emergency room after his neighbor found him down at his home.  1.  Rhabdomyolysis due to fall and being found on the ground: CK is elevated today.  Continue IV fluids.  CK for a.m.  2.  Fall likely related to his underlying Parkinson's disease however will need to rule out cardiac/neurological etiology  CT scans as  above Follow-up on echocardiogram/carotid Doppler PT/OT and speech consult pending   3.   hypertension: PRN hydralazine and labetalol ordered Add norvasc 10 m g daily   4.  Parkinson's disease: Continue Sinemet  5.  Hypokalemia: Replete Management plans discussed with the patient and he is in agreement.  CODE STATUS: DNR Patient's daughter Randa EvensJoanne is power of attorney  TOTAL TIME TAKING CARE OF THIS PATIENT: 30 minutes.     POSSIBLE D/C 2 days, DEPENDING ON CLINICAL CONDITION.   Adrian SaranSital Gayleen Sholtz M.D on 02/21/2019 at 10:57 AM  Between 7am to 6pm - Pager - [phone] After 6pm go to [email or web address] - password [email or web address]  [facility]  Office  [phone]  CC: Primary care physician; Dellia NimsKeplinger, Lynn E, MD  Note: This dictation was prepared with Dragon dictation along with smaller phrase technology. Any transcriptional errors that result from this process are unintentional.

## 2019-02-21 NOTE — TOC Initial Note (Signed)
Transition of Care [facility]) - Initial/Assessment Note    Patient Details  Name: Stephen Mcknight MRN: [id number] Date of Birth: 15-Jun-1946  [facility] CM/SW Contact:    Candie Chroman, LCSW Phone Number: 02/21/2019, 3:23 PM  Clinical Narrative:  Patient not fully oriented. CSW called daughter, introduced role, and explained that discharge planning would be discussed. She confirmed that he is getting HHPT and RN. He had a 24/7 caregiver but a few days ago "ran them off." Daughter stated that patient will need to be placed in a SNF. First preference is [facility] because her mother was there. Plan is for transition to LTC after rehab. Patient does not have Medicaid but will notify SNF admissions coordinator for whichever SNF he goes to so they can start working on that process. Patient gets $2100 per month so he will have to do a spend down. PT/OT evals are pending. No further concerns. CSW encouraged patient's daughter to contact CSW as needed. CSW will continue to follow patient and his daughter for support and facilitate discharge to SNF once medically stable.                Expected Discharge Plan: Skilled Nursing Facility Barriers to Discharge: Continued Medical Work up   Patient Goals and CMS Choice Patient states their goals for this hospitalization and ongoing recovery are:: Patient not fully oriented.      Expected Discharge Plan and Services Expected Discharge Plan: [place] Choice: [place] arrangements for the past 2 months: Single Family Home                                      Prior Living Arrangements/Services Living arrangements for the past 2 months: Single Family Home Lives with:: Self(Had a 24/7 caregiver but per daughter he "ran them off.") Patient language and need for interpreter reviewed:: Yes Do you feel safe going back to the place where you live?: No      Need for Family  Participation in Patient Care: Yes (Comment) Care giver support system in place?: No (comment) Current home services: Home PT, Home RN Criminal Activity/Legal Involvement Pertinent to Current Situation/Hospitalization: No - Comment as needed  Activities of Daily Living Home Assistive Devices/Equipment: Cane (specify quad or straight) ADL Screening (condition at time of admission) Patient's cognitive ability adequate to safely complete daily activities?: No Is the patient deaf or have difficulty hearing?: No Does the patient have difficulty seeing, even when wearing glasses/contacts?: No Does the patient have difficulty concentrating, remembering, or making decisions?: Yes Patient able to express need for assistance with ADLs?: Yes Does the patient have difficulty dressing or bathing?: Yes Independently performs ADLs?: No Communication: Independent Dressing (OT): Needs assistance Is this a change from baseline?: Pre-admission baseline Grooming: Needs assistance Is this a change from baseline?: Pre-admission baseline Feeding: Needs assistance Is this a change from baseline?: Pre-admission baseline Bathing: Needs assistance Is this a change from baseline?: Pre-admission baseline Toileting: Needs assistance Is this a change from baseline?: Pre-admission baseline In/Out Bed: Needs assistance Is this a change from baseline?: Pre-admission baseline Walks in Home: Needs assistance Is this a change from baseline?: Pre-admission baseline Does the patient have difficulty walking or climbing stairs?: Yes Weakness of Legs: Both Weakness of Arms/Hands: Both  Permission Sought/Granted Permission sought to share information with : Customer service manager, Family  Supports Permission granted to share information with : Yes, Verbal Permission Granted(Patient not fully oriented.)  Share Information with NAME: Jeanelle MallingJoann Eastburn  Permission granted to share info w AGENCY: SNF's  Permission  granted to share info w Relationship: Daughter  Permission granted to share info w Contact Information: [phone]  Emotional Assessment Appearance:: Appears stated age Attitude/Demeanor/Rapport: Unable to Assess Affect (typically observed): Unable to Assess Orientation: : Oriented to Self, Oriented to Place Alcohol / Substance Use: Never Used Psych Involvement: No (comment)  Admission diagnosis:  Fall [W19.XXXA] Non-traumatic rhabdomyolysis [M62.82] Patient Active Problem List   Diagnosis Date Noted  . Rhabdomyolysis 02/20/2019   PCP:  Dellia NimsKeplinger, Lynn E, MD Pharmacy:   [facility] [phone] [place] , [place] - [address] [facility] [address] [place] [id number] Phone: [phone] Fax: [phone]     Social Determinants of Health (SDOH) Interventions    Readmission Risk Interventions No flowsheet data found.

## 2019-02-21 NOTE — Progress Notes (Signed)
EEG completed, results pending. 

## 2019-02-21 NOTE — Progress Notes (Addendum)
Initial Nutrition Assessment  DOCUMENTATION CODES:   Underweight  INTERVENTION:   Ensure Enlive po BID, each supplement provides 350 kcal and 20 grams of protein  Magic cup TID with meals, each supplement provides 290 kcal and 9 grams of protein  MVI daily   Pt likely at refeed risk; recommend monitor K, Mg and P labs daily as oral intake improves.   NUTRITION DIAGNOSIS:   Unintentional weight loss related to chronic illness(parkinson's disease) as evidenced by 20 percent weight loss in < 1 year .  GOAL:   Patient will meet greater than or equal to 90% of their needs  MONITOR:   PO intake, Supplement acceptance, Labs, Weight trends, Skin, I & O's  REASON FOR ASSESSMENT:   Other (Comment)(Low BMI)    ASSESSMENT:   73 year old male with pertinent past medical history of Parkinson's disease, strabismus, hypertension, and heart murmur who was found down by neighbor this morning.  RD working remotely.  Unable to speak with pt as pt is a poor historian. Suspect pt with poor appetite and oral intake at baseline. Pt does wear dentures. Per chart, pt has lost 32lbs(20%) in < 1 year; RD unsure how recent weight loss actually occurred. Pt currently on dysphagia 1/thin liquid diet. SLP following. RD will add supplements and MVI to help pt meet his estimated needs. Suspect pt is at refeed risk.  Pt at high risk for malnutrition but unable to diagnose at this time as NFPE cannot be performed..    Medications reviewed and include: aspirin, lovenox, mirtazapine, MVI, protonix, NaCl @125ml /hr  Labs reviewed: K 3.7 wnl, Mg 2.0 wnl  Unable to complete Nutrition-Focused physical exam at this time.   Diet Order:   Diet Order            DIET - DYS 1 Room service appropriate? Yes; Fluid consistency: Thin  Diet effective now             EDUCATION NEEDS:   No education needs have been identified at this time  Skin:  Skin Assessment: Reviewed RN Assessment  Last BM:   PTA  Height:   Ht Readings from Last 1 Encounters:  02/20/19 6\' 2"  (1.88 m)    Weight:   Wt Readings from Last 1 Encounters:  02/20/19 60.1 kg    Ideal Body Weight:  86.3 kg  BMI:  Body mass index is 17.01 kg/m.  Estimated Nutritional Needs:   Kcal:  1800-2100kcal/day  Protein:  90-105g/day  Fluid:  >1.5L/day  Koleen Distance MS, RD, LDN Pager #- [phone] Office#- [phone] After Hours Pager: [phone]

## 2019-02-21 NOTE — Progress Notes (Signed)
*  PRELIMINARY RESULTS* Echocardiogram 2D Echocardiogram has been performed.  Stephen Mcknight 02/21/2019, 1:16 PM

## 2019-02-21 NOTE — Procedures (Signed)
Patient Name: Stephen Mcknight  MRN: [id number]  Epilepsy Attending: Lora Havens  Referring Physician/Provider: Rufina Falco, NP Date: 02/21/19 Duration: 24.11 mins  Patient history: 73 year old male with history of Parkinson's disease who presented after being found down. EEG to evaluate for seizure.   Level of alertness: Awake, asleep  AEDs during EEG study: None  Technical aspects: This EEG study was done with scalp electrodes positioned according to the 10-20 International system of electrode placement. Electrical activity was reviewed with a high frequency filter of 70Hz  and a low frequency filter of 1Hz . EEG data were recorded continuously and digitally stored.   BACKGROUND ACTIVITY: Posterior dominant rhythm: The posterior dominant rhythm consists of 6-7 Hz activity of moderate voltage (25-35 uV) seen predominantly in posterior head regions, symmetric and reactive to eye opening and eye closing.           Slowing: Continuous 2-5 Hz theta-delta slowing as well as intermittent rhythmic 2-3 Hz delta slowing was seen  SLEEP RECORDINGS:  Vertex waves maximal in frontocentral region were noted  IMPRESSION: This study is suggestive of mild diffuse encephalopathy. No seizures or epileptiform discharges were seen throughout the recording.  [name] Barbra Sarks

## 2019-02-21 NOTE — Progress Notes (Signed)
OT Cancellation Note  Patient Details Name: Stephen Mcknight MRN: [id number] DOB: [birth year]   Cancelled Evaluation:    Reason Eval/Treat Not Completed: Patient not medically ready. Order received and chart reviewed. Pt noted to have BP of 204/108. Per RN, pt is somnolent and had just received BP medication. Is not appropriate for OT evaluation at this time. Will follow acutely and re-attempt at a later time/date as available and pt medically appropriate for OT eval.   Shara Blazing, M.S., OTR/L Ascom: [phone] 02/21/19, 10:26 AM

## 2019-02-21 NOTE — Progress Notes (Signed)
PT Cancellation Note  Patient Details Name: Stephen Mcknight MRN: [id number] DOB: 1946/06/02   Cancelled Treatment:    Reason Eval/Treat Not Completed: Medical issues which prohibited therapy(Pt with poorly controlled HTN this AM outside of safe range for exertion required for PT evaluation. Will attempt again at later date/time. Most recently 187/49mmHg.)  11:44 AM, 02/21/19 Etta Grandchild, PT, DPT Physical Therapist - [facility]  [phone] ([place])    Gwenette Wellons C 02/21/2019, 11:44 AM

## 2019-02-21 NOTE — Progress Notes (Signed)
Notified Dr Benjie Karvonen of BP 206/102. HR 67. No BP meds ordered. IVF 151ml/hr.  Pt lethargic and somnolent. Pt was lethargic last night per night RN. Unable to take PO meds.  MD to place orders.

## 2019-02-21 NOTE — Evaluation (Signed)
SLP Cancellation Note  Patient Details Name: Stephen Mcknight MRN: [id number] DOB: 06-17-1946   Cancelled treatment:       Reason Eval/Treat Not Completed: Fatigue/lethargy limiting ability to participate;Medical issues which prohibited therapy;Pain limiting ability to participate;Patient not medically ready  ST discussed with RN pt is lethargic and unable to participate in ST assessment. RN and st agree pt is not medically ready for evaluation. ST will attempt again 7/25 if able or as pt becomes more alert, RN agreed to notify.    [place] Sauber 02/21/2019, 11:16 AM

## 2019-02-21 NOTE — Progress Notes (Signed)
PT Cancellation Note  Patient Details Name: Stephen Mcknight MRN: [id number] DOB: 1945-08-23   Cancelled Treatment:    Reason Eval/Treat Not Completed: Fatigue/lethargy limiting ability to participate(BP noted to be better controled. Author made attempt to see patient. Pt is quite lethargic and difficult to understand. Pt also found with liquid brown stool between legs in bed. RN made aware.) Will attempt PT evaluation again at later date/time as patient is appropriate.   3:14 PM, 02/21/19 Etta Grandchild, PT, DPT Physical Therapist - [facility]  [phone] ([place])   [place] C 02/21/2019, 3:13 PM

## 2019-02-21 NOTE — Progress Notes (Signed)
Family Meeting Note  Advance Directive:yes  Today a meeting took place with the Patient.   The following clinical team members were present during this meeting:MD  The following were discussed:Patient's diagnosis: parkinsons Rhabd falls , Patient's progosis: Unable to determine and Goals for treatment: DNR  Additional follow-up to be provided: patient needs outpatient PC  His daughter Stephen Mcknight is POA AD needs to be created consult chaplain  Time spent during discussion:16 minutes  Bettey Costa, MD

## 2019-02-21 NOTE — Progress Notes (Signed)
OT Cancellation Note  Patient Details Name: Benzion Mesta MRN: [id number] DOB: September 01, 1945   Cancelled Treatment:    Reason Eval/Treat Not Completed: Other (comment). PT attempting to see pt. Will re-attempt at later date/time as available and medically appropriate.   Jeni Salles, MPH, MS, OTR/L ascom [phone] 02/21/19, 3:09 PM

## 2019-02-21 NOTE — NC FL2 (Signed)
[place] MEDICAID FL2 LEVEL OF CARE SCREENING TOOL     IDENTIFICATION  Patient Name: Stephen FieldCarl Gwyn Birthdate: [birth year] Sex: male Admission Date (Current Location): 02/20/2019  [place]   Facility and Address:  [facility], [address], [place], [place] [address]      Provider Number: [id number]  Attending Physician Name and Address:  Adrian SaranMody, Sital, MD  Relative Name and Phone Number:       Current Level of Care: Hospital Recommended Level of Care: Skilled Nursing Facility Prior Approval Number:    Date Approved/Denied:   PASRR Number: [id number]  Discharge Plan: SNF    Current Diagnoses: Patient Active Problem List   Diagnosis Date Noted  . Rhabdomyolysis 02/20/2019    Orientation RESPIRATION BLADDER Height & Weight     Self, Place  Normal Incontinent, External catheter Weight: 132 lb 7.9 oz (60.1 kg) Height:  6\' 2"  (188 cm)  BEHAVIORAL SYMPTOMS/MOOD NEUROLOGICAL BOWEL NUTRITION STATUS  (Not interactive) (None) Continent Diet(DYS 1)  AMBULATORY STATUS COMMUNICATION OF NEEDS Skin   Extensive Assist Verbally Skin abrasions                       Personal Care Assistance Level of Assistance  Bathing, Feeding, Dressing Bathing Assistance: Maximum assistance Feeding assistance: Limited assistance Dressing Assistance: Maximum assistance     Functional Limitations Info  Sight, Hearing, Speech Sight Info: Adequate Hearing Info: Adequate Speech Info: Impaired(Slurred/dysarthria)    SPECIAL CARE FACTORS FREQUENCY  PT (By licensed PT), OT (By licensed OT), Speech therapy     PT Frequency: 5 x week OT Frequency: 5 x week     Speech Therapy Frequency: 5 x week      Contractures Contractures Info: Not present    Additional Factors Info  Code Status, Allergies Code Status Info: DNR Allergies Info: NKDA           Current Medications (02/21/2019):  This is the current hospital active  medication list Current Facility-Administered Medications  Medication Dose Route Frequency Provider Last Rate Last Dose  . 0.9 %  sodium chloride infusion   Intravenous Continuous Jimmye Normanuma, Elizabeth Achieng, NP 125 mL/hr at 02/21/19 0912    . amLODipine (NORVASC) tablet 10 mg  10 mg Oral Daily Mody, Sital, MD      . aspirin EC tablet 81 mg  81 mg Oral Daily Ouma, Hubbard HartshornElizabeth Achieng, NP      . carbidopa-levodopa (SINEMET IR) 25-100 MG per tablet immediate release 2 tablet  2 tablet Oral 5 X Daily Ouma, Hubbard HartshornElizabeth Achieng, NP      . docusate sodium (COLACE) capsule 100 mg  100 mg Oral BID PRN Jimmye Normanuma, Elizabeth Achieng, NP      . enoxaparin (LOVENOX) injection 40 mg  40 mg Subcutaneous Q24H Jimmye Normanuma, Elizabeth Achieng, NP   40 mg at 02/20/19 2127  . hydrALAZINE (APRESOLINE) injection 10 mg  10 mg Intravenous Q6H PRN Adrian SaranMody, Sital, MD   10 mg at 02/21/19 1109  . labetalol (NORMODYNE) injection 10 mg  10 mg Intravenous Q6H PRN Adrian SaranMody, Sital, MD   10 mg at 02/21/19 1015  . methocarbamol (ROBAXIN) tablet 1,000 mg  1,000 mg Oral QHS PRN Jimmye Normanuma, Elizabeth Achieng, NP      . mirtazapine (REMERON) tablet 15 mg  15 mg Oral QHS Ouma, Hubbard HartshornElizabeth Achieng, NP      . multivitamin with minerals tablet 1 tablet  1 tablet Oral Daily Ouma, Hubbard HartshornElizabeth Achieng, NP      .  oxybutynin (DITROPAN) tablet 5 mg  5 mg Oral QHS Ouma, Bing Neighbors, NP      . pantoprazole (PROTONIX) EC tablet 40 mg  40 mg Oral Daily Ouma, Bing Neighbors, NP      . pramipexole (MIRAPEX) tablet 0.5 mg  0.5 mg Oral BID Lang Snow, NP         Discharge Medications: Please see discharge summary for a list of discharge medications.  Relevant Imaging Results:  Relevant Lab Results:   Additional Information SS#: [id number]  Candie Chroman, LCSW

## 2019-02-21 NOTE — Progress Notes (Signed)
   02/21/19 1200  Clinical Encounter Type  Visited With Patient;Health care provider  Visit Type Initial  Referral From Physician   Chaplain received an OR to complete or update an AD for the patient. The patient's nurse confirmed that based upon the patient's status, this is not an appropriate time to consider completion of the AD documents. Upon arrival, the patient was resting in bed with his eyes closed and knees bent. The patient was easily roused to verbal stimuli and asked for his covers to pulled up; chaplain obliged. The patient expressed appreciation for the visit.

## 2019-02-22 LAB — CK: Total CK: 682 U/L — ABNORMAL HIGH (ref 49–397)

## 2019-02-22 MED ORDER — BISACODYL 5 MG PO TBEC: 5.0000 mg | DELAYED_RELEASE_TABLET | Freq: Every day | ORAL | Status: DC | PRN

## 2019-02-22 NOTE — Evaluation (Addendum)
Occupational Therapy Evaluation Patient Details Name: Stephen Mcknight MRN: [id number] DOB: 08/01/1945 Today's Date: 02/22/2019    History of Present Illness Pt. is a 73 y.o. male who [facility] wIth Rhabdomyolysis, Parkinsons Disease after being found on the floor by a neighbor. PMHx includes: Strabismus, HTN, and Heart Murmur.   Clinical Impression   Pt. presents with profound BUE weakness, limited activity tolerance, and impaired functional mobility which limits his ability to complete basic ADL functioning. Pt. Resides at home alone  Pt. Required assist with all ADL, and IADL tasks from personal caregivers. Pt. had caregivers 24/7 to assist with all ADL, and IADL care needs.  Per pt.'s chart per daughter he "ran them off" a few days ago. Pt. has limited BUE strength, and has difficulty handling utensils, and performing, and completing hand to mouth patterns self-feeding, and grooming. Pt. Could benefit from OT services for ADL training, A/E training, neuromuscular re-education, and pt.caregiver education about positioning, and ADL functioning, and A/E use. Pt. would benefit from SNF level of care upon discharge. Pt. Could benefit from follow-up OT services at discharge.   Follow Up Recommendations  SNF    Equipment Recommendations       Recommendations for Other Services       Precautions / Restrictions Precautions Precautions: None Restrictions Weight Bearing Restrictions: No      Mobility Bed Mobility    Total Assist              Transfers    Deferred                  Balance                                           ADL either performed or assessed with clinical judgement   ADL Overall ADL's : Needs assistance/impaired Eating/Feeding: Maximal assistance   Grooming: Set up;Maximal assistance   Upper Body Bathing: Total assistance   Lower Body Bathing: Total assistance   Upper Body Dressing : Total assistance   Lower Body  Dressing: Total assistance   Toilet Transfer: Total assistance   Toileting- Clothing Manipulation and Hygiene: Total assistance               Vision Patient Visual Report: Blurring of vision(Pt. keeps his right eye closed.)       Perception     Praxis      Pertinent Vitals/Pain Pain Assessment: Faces(Right sided jaw, and hand pain.) Pain Score: (Not rated)     Hand Dominance Right   Extremity/Trunk Assessment Upper Extremity Assessment Upper Extremity Assessment: Generalized weakness(Profound weakness BUE with limited AROM throughout BUEs.)           Communication Communication Communication: No difficulties   Cognition Arousal/Alertness: Lethargic Behavior During Therapy: WFL for tasks assessed/performed Overall Cognitive Status: Difficult to assess                                     General Comments       Exercises     Shoulder Instructions      Home Living Family/patient expects to be discharged to:: Private residence Living Arrangements: Alone Available Help at Discharge: Personal care attendant;Neighbor   Home Access: Stairs to enter Entergy CorporationEntrance Stairs-Number of Steps: 3   Home Layout: One level  Bathroom Shower/Tub: Gaffer;Door         Home Equipment: Shower seat - built in;Hand held shower head          Prior Functioning/Environment Level of Independence: Needs assistance    ADL's / Homemaking Assistance Needed: Pt. has had caregivers 24/7. Per chart pt. "ran them off" a few days ago. Pt. required assist with ADLs,a nd IADLs.            OT Problem List: Decreased strength;Impaired UE functional use;Decreased activity tolerance      OT Treatment/Interventions: Self-care/ADL training;Therapeutic exercise;Patient/family education;DME and/or AE instruction    OT Goals(Current goals can be found in the care plan section) Acute Rehab OT Goals Patient Stated Goal: To return home OT Goal Formulation: With  patient Potential to Achieve Goals: Fair  OT Frequency: Min 2X/week   Barriers to D/C:            Co-evaluation              AM-PAC OT "6 Clicks" Daily Activity     Outcome Measure Help from another person eating meals?: A Lot Help from another person taking care of personal grooming?: A Lot Help from another person toileting, which includes using toliet, bedpan, or urinal?: Total Help from another person bathing (including washing, rinsing, drying)?: Total Help from another person to put on and taking off regular upper body clothing?: Total Help from another person to put on and taking off regular lower body clothing?: Total 6 Click Score: 8   End of Session Equipment Utilized During Treatment: Gait belt  Activity Tolerance: Patient tolerated treatment well Patient left: with call bell/phone within reach;in bed;with bed alarm set  OT Visit Diagnosis: Muscle weakness (generalized) (M62.81)                Time: 9024-0973 OT Time Calculation (min): 20 min Charges:  OT General Charges $OT Visit: 1 Visit OT Evaluation $OT Eval Moderate Complexity: 1 Mod  Harrel Carina, MS, OTR/L  Harrel Carina 02/22/2019, 12:24 PM

## 2019-02-22 NOTE — Evaluation (Signed)
Clinical/Bedside Swallow Evaluation Patient Details  Name: Stephen Mcknight MRN: [id number] Date of Birth: [birth year]  Today's Date: 02/22/2019 Time: SLP Start Time (ACUTE ONLY): 1010 SLP Stop Time (ACUTE ONLY): 1035 SLP Time Calculation (min) (ACUTE ONLY): 25 min  Past Medical History:  Past Medical History:  Diagnosis Date  . Hypertension   . Parkinson disease [facility])    Past Surgical History:  Past Surgical History:  Procedure Laterality Date  . CHOLECYSTECTOMY     HPI:      Assessment / Plan / Recommendation Clinical Impression  pt presents with a oral dysphagia as characterized by poor oral transit, impaired mastication likely due to PD.  pt was noted to be very tired during session as he had recently received PT and OT services back to back. pt may have had increased success if he had been fully awake for tx. pt did not have overt ssx aspiration with solids or liquids. pt did have prolonged a to p transit and didappear to fall asleep with bolus in oral cavity requiring st to cue to continue to masticate and swallow foods. pt able to intake liquids via straw with no overt ssx aspiration. ST reccomendation to continue with current diet and st will continue to follow. SLP Visit Diagnosis: Dysphagia, oral phase (R13.11)    Aspiration Risk  Mild aspiration risk;Moderate aspiration risk    Diet Recommendation Dysphagia 1 (Puree);Thin liquid   Liquid Administration via: Straw;Cup Medication Administration: Crushed with puree Supervision: Staff to assist with self feeding Compensations: Slow rate;Small sips/bites;Follow solids with liquid Postural Changes: Seated upright at 90 degrees    Other  Recommendations Oral Care Recommendations: Oral care BID   Follow up Recommendations        Frequency and Duration min 3x week  2 weeks       Prognosis Prognosis for Safe Diet Advancement: Good      Swallow Study   General Date of Onset: 02/21/19 Type of Study: Bedside Swallow  Evaluation Diet Prior to this Study: Dysphagia 1 (puree);Thin liquids Temperature Spikes Noted: No Respiratory Status: Room air History of Recent Intubation: No Behavior/Cognition: Cooperative;Pleasant mood;Alert Oral Cavity Assessment: Within Functional Limits Oral Care Completed by SLP: No Oral Cavity - Dentition: Missing dentition Vision: Functional for self-feeding Self-Feeding Abilities: Needs assist Patient Positioning: Upright in bed Baseline Vocal Quality: Aphonic    Oral/Motor/Sensory Function     Ice Chips Ice chips: Within functional limits Presentation: Spoon   Thin Liquid Thin Liquid: Within functional limits Presentation: Straw    Nectar Thick Nectar Thick Liquid: Not tested   Honey Thick Honey Thick Liquid: Not tested   Puree Puree: Within functional limits Presentation: Spoon   Solid     Solid: Impaired Oral Phase Impairments: Poor awareness of bolus;Impaired mastication;Reduced lingual movement/coordination Oral Phase Functional Implications: Impaired mastication;Prolonged oral transit;Oral holding Pharyngeal Phase Impairments: Suspected delayed Swallow      Stephen Mcknight 02/22/2019,11:02 AM

## 2019-02-22 NOTE — Evaluation (Signed)
Physical Therapy Evaluation Patient Details Name: Stephen Mcknight MRN: [id number] DOB: [birth year] Today's Date: 02/22/2019   History of Present Illness  Pt. is a 73 y.o. male who was admitted to [facility] wIth Rhabdomyolysis, Parkinsons Disease after being found on the floor by a neighbor. PMHx includes: Strabismus, HTN, and Heart Murmur.  Clinical Impression  Pt admitted with above diagnosis. Pt currently with functional limitations due to the deficits listed below (see "PT Problem List"). Upon entry, pt in bed, awake and agreeable to participate. The pt is alert (slightly drowsy) and oriented x4, pleasant, conversational, and generally a fair historian. Communication is very limited by hypophonia and dysarthria which are consistent with chronic parkinsonism, likely made worse by encephalopathic state. Pt has severe gross motor weakness and generalized rigidity, the later also likely related to parkinsonism, pt now requiring total assistance for all bed mobility. Pt is unable to bring his hand to mouth when cued due to weakness. Functional mobility assessment demonstrates increased effort/time requirements, poor tolerance, and absolute need for heavy physical assistance, whereas the patient performed these at a higher level of independence PTA. Pt will benefit from skilled PT intervention to increase independence and safety with basic mobility in preparation for discharge to the venue listed below.       Follow Up Recommendations SNF;Supervision/Assistance - 24 hour    Equipment Recommendations  Other (comment);None recommended by PT(to be determined by facility; is currently nonambulatory and would need a mechanical lift and WC if DC to home)    Recommendations for Other Services       Precautions / Restrictions Precautions Precautions: Fall      Mobility  Bed Mobility Overal bed mobility: Needs Assistance Bed Mobility: Rolling Rolling: Total assist         General bed mobility comments:  Total assist for rolling in bed bilat and scooting up in bed. Unable to position limbs in anticipation for mobility without max assistance  Transfers                    Ambulation/Gait                Stairs            Wheelchair Mobility    Modified Rankin (Stroke Patients Only)       Balance                                             Pertinent Vitals/Pain Pain Assessment: No/denies pain(fluctuating and inconsistent report but moving limbs without grimacing.) Pain Intervention(s): Limited activity within patient's tolerance;Monitored during session;Repositioned    Home Living Family/patient expects to be discharged to:: Private residence Living Arrangements: Alone Available Help at Discharge: Personal care attendant;Neighbor Type of Home: House Home Access: Stairs to enter   Technical brewer of Steps: 3 Home Layout: One level Home Equipment: Shower seat - built in;Hand held Veterinary surgeon - single point      Prior Function Level of Independence: Needs assistance      ADL's / Homemaking Assistance Needed: Pt. has had caregivers 24/7. Per chart pt. "ran them off" a few days ago. Pt. required assist with ADLs,a nd IADLs.        Hand Dominance   Dominant Hand: Right    Extremity/Trunk Assessment   Upper Extremity Assessment Upper Extremity Assessment: Generalized weakness(grossly 3-/5; Pt unable to bring either  hand to mouth when cued, fatigues easily)    Lower Extremity Assessment Lower Extremity Assessment: Generalized weakness(grossly 2+/5, gross rigidity with poor ability to provide resistance when cued)    Cervical / Trunk Assessment Cervical / Trunk Assessment: Kyphotic(forward head posturing)  Communication   Communication: Other (comment);Expressive difficulties(Parkinsonian hypophonia and mumbling)  Cognition Arousal/Alertness: Awake/alert Behavior During Therapy: WFL for tasks  assessed/performed Overall Cognitive Status: Within Functional Limits for tasks assessed                                        General Comments      Exercises     Assessment/Plan    PT Assessment Patient needs continued PT services  PT Problem List Decreased strength;Decreased range of motion;Decreased activity tolerance;Decreased mobility       PT Treatment Interventions Therapeutic exercise;Gait training;Stair training;Functional mobility training;Therapeutic activities;Patient/family education;Neuromuscular re-education;Balance training    PT Goals (Current goals can be found in the Care Plan section)  Acute Rehab PT Goals Patient Stated Goal: To return home PT Goal Formulation: With patient Time For Goal Achievement: 03/14/19 Potential to Achieve Goals: Fair    Frequency Min 2X/week   Barriers to discharge Inaccessible home environment;Decreased caregiver support caregivers likely unable to provide totalA for mobility needs    Co-evaluation               AM-PAC PT "6 Clicks" Mobility  Outcome Measure Help needed turning from your back to your side while in a flat bed without using bedrails?: Total Help needed moving from lying on your back to sitting on the side of a flat bed without using bedrails?: Total Help needed moving to and from a bed to a chair (including a wheelchair)?: Total Help needed standing up from a chair using your arms (e.g., wheelchair or bedside chair)?: Total Help needed to walk in hospital room?: Total Help needed climbing 3-5 steps with a railing? : Total 6 Click Score: 6    End of Session   Activity Tolerance: Patient limited by fatigue;No increased pain;Patient tolerated treatment well Patient left: in bed;with call bell/phone within reach(heels elevated, sitting upright in bed) Nurse Communication: Mobility status PT Visit Diagnosis: Unsteadiness on feet (R26.81);History of falling (Z91.81);Difficulty in walking,  not elsewhere classified (R26.2);Other symptoms and signs involving the nervous system (R29.898)    Time: [id number] PT Time Calculation (min) (ACUTE ONLY): 13 min   Charges:   PT Evaluation $PT Eval Moderate Complexity: 1 Mod          5:07 PM, 02/22/19 Rosamaria LintsAllan C Colm Lyford, PT, DPT Physical Therapist - [facility]  [phone] (ASCOM)   Jasiah Buntin C 02/22/2019, 5:04 PM

## 2019-02-22 NOTE — Progress Notes (Signed)
[facility] - [place] at [facility]   PATIENT NAME: Stephen Mcknight    MR#:  [id number]  DATE OF BIRTH:  [birth year]  SUBJECTIVE:  The patient is more awake and alert.  He has generalized weakness and muscle sore. REVIEW OF SYSTEMS:  Limited review of systems due to some confusion Review of Systems  Constitutional: Negative for fever, chills weight loss.  Generalized weakness.    Respiratory: Denies cough, shortness of breath, wheezing  Cardiovascular: Negative for chest pain, palpitations and leg swelling.  Gastrointestinal: Negative for heartburn, abdominal pain, vomiting, diarrhea or consitpation Musculoskeletal: Negative for back pain or joint pain.  Muscle sore. Neurological:   Positive for Parkinson's Some lethargy this morning Psychiatric/Behavioral: Positive confusion DRUG ALLERGIES:  No Known Allergies  VITALS:  Blood pressure (!) 155/87, pulse 66, temperature (!) 97.5 F (36.4 C), temperature source Oral, resp. rate 18, height 6\' 2"  (1.88 m), weight 60.1 kg, SpO2 100 %.  PHYSICAL EXAMINATION:  Constitutional: Appears with parkinsonian no distress. HENT: Normocephalic. Marland Kitchen. Oropharynx is clear and moist.  Eyes: Conjunctivae and EOM are normal. PERRLA, no scleral icterus.  Neck: Normal ROM. Neck supple. No JVD. No tracheal deviation. CVS: RRR, S1/S2 +, no murmurs, no gallops, no carotid bruit.  Pulmonary: Effort and breath sounds normal, no stridor, rhonchi, wheezes, rales.  Abdominal: Soft. BS +,  no distension, tenderness, rebound or guarding.  Musculoskeletal: No edema  He has pill-rolling tremor  neuro: Follow-up limited commands. Skin: Skin is warm and dry. No rash noted. Psychiatric: Looks confused but AAO x3. LABORATORY PANEL:   CBC Recent Labs  Lab 02/21/19 0425  WBC 9.8  HGB 11.7*  HCT 34.5*  PLT 189   ------------------------------------------------------------------------------------------------------------------  Chemistries  Recent  Labs  Lab 02/20/19 1020 02/21/19 0425  NA 139 140  K 3.9 3.7  CL 103 108  CO2 26 25  GLUCOSE 140* 96  BUN 32* 22  CREATININE 0.97 0.79  CALCIUM 9.2 8.4*  MG  --  2.0  AST 31  --   ALT 30  --   ALKPHOS 51  --   BILITOT 0.7  --    ------------------------------------------------------------------------------------------------------------------  Cardiac Enzymes No results for input(s): TROPONINI in the last 168 hours. ------------------------------------------------------------------------------------------------------------------  RADIOLOGY:  [place] Carotid Bilateral  Result Date: 02/22/2019 CLINICAL DATA:  Syncopal episode. History of hypertension and smoking. EXAM: BILATERAL CAROTID DUPLEX ULTRASOUND TECHNIQUE: Wallace CullensGray scale imaging, color Doppler and duplex ultrasound were performed of bilateral carotid and vertebral arteries in the neck. COMPARISON:  None. FINDINGS: Criteria: Quantification of carotid stenosis is based on velocity parameters that correlate the residual internal carotid diameter with NASCET-based stenosis levels, using the diameter of the distal internal carotid lumen as the denominator for stenosis measurement. The following velocity measurements were obtained: RIGHT ICA: 87/29 cm/sec CCA: 74/19 cm/sec SYSTOLIC ICA/CCA RATIO:  1.2 ECA: 96 cm/sec LEFT ICA: 85/21 cm/sec CCA: 73/15 cm/sec SYSTOLIC ICA/CCA RATIO:  1.2 ECA: 45 cm/sec RIGHT CAROTID ARTERY: There is a moderate to large amount of eccentric echogenic plaque within the right carotid bulb (image 15), extending to involve the origin and proximal aspects of the right internal carotid artery (image 22, not resulting in elevated peak systolic velocities within the interrogated course of the right internal carotid artery to suggest a hemodynamically significant stenosis. RIGHT VERTEBRAL ARTERY:  Antegrade Flow LEFT CAROTID ARTERY: There is a moderate to large amount of eccentric echogenic plaque within the left carotid bulb  (image 47), extending to involve the origin and proximal  aspects of the left internal carotid artery (image 55), not resulting in elevated peak systolic velocities within the interrogated course of the left internal carotid artery to suggest a hemodynamically significant stenosis. LEFT VERTEBRAL ARTERY:  Antegrade Flow IMPRESSION: Moderate to large amount of bilateral atherosclerotic plaque, not resulting in a hemodynamically significant stenosis within either internal carotid artery. Electronically Signed   By: Sandi Mariscal M.D.   On: 02/22/2019 09:09     ASSESSMENT AND PLAN:   73 year old male with history of Parkinson's disease who presents to the emergency room after his neighbor found him down at his home.  1.  Rhabdomyolysis due to fall and being found on the ground:   Continue IV fluids.  CK for a.m.  2.  Fall likely related to his underlying Parkinson's disease  Echocardiography is unremarkable.  Carotid duplex report Moderate to large amount of bilateral atherosclerotic plaque, not resulting in a hemodynamically significant stenosis within either internal carotid artery.  CT scans as above Follow-up on echocardiogram/carotid Doppler PT/OT: SNF.  3.   hypertension: PRN hydralazine and labetalol ordered Added norvasc 10 m g daily  4.  Parkinson's disease: Continue Sinemet  5.  Hypokalemia: Improved with supplement. Management plans discussed with the patient and he is in agreement. I discussed with patient daughter. CODE STATUS: DNR Patient's daughter Mechele Claude is power of attorney.  TOTAL TIME TAKING CARE OF THIS PATIENT: 28 minutes.  POSSIBLE D/C 2 days, DEPENDING ON CLINICAL CONDITION.  Demetrios Loll M.D on 02/22/2019 at 1:39 PM  Between 7am to 6pm - Pager - [phone] After 6pm go to [email or web address] - password [email or web address] [facility]  Office  [phone]  CC: Primary care physician; Ronnie Doss, MD  Note: This dictation was prepared with Dragon  dictation along with smaller phrase technology. Any transcriptional errors that result from this process are unintentional.

## 2019-02-23 LAB — CK: Total CK: 645 U/L — ABNORMAL HIGH (ref 49–397)

## 2019-02-23 LAB — BASIC METABOLIC PANEL
Anion gap: 7 (ref 5–15)
BUN: 12 mg/dL (ref 8–23)
CO2: 24 mmol/L (ref 22–32)
Calcium: 8.1 mg/dL — ABNORMAL LOW (ref 8.9–10.3)
Chloride: 107 mmol/L (ref 98–111)
Creatinine, Ser: 0.62 mg/dL (ref 0.61–1.24)
GFR calc Af Amer: >60 mL/min (ref >60)
GFR calc non Af Amer: >60 mL/min (ref >60)
Glucose, Bld: 88 mg/dL (ref 70–99)
Potassium: 3.1 mmol/L — ABNORMAL LOW (ref 3.5–5.1)
Sodium: 138 mmol/L (ref 135–145)

## 2019-02-23 LAB — MAGNESIUM: Magnesium: 1.8 mg/dL (ref 1.7–2.4)

## 2019-02-23 MED ORDER — POTASSIUM CHLORIDE CRYS ER 20 MEQ PO TBCR
40.0000 meq | EXTENDED_RELEASE_TABLET | Freq: Once | ORAL | Status: AC
  Administered 2019-02-23: 11:00:00 40 meq via ORAL
  Filled 2019-02-23: qty 2

## 2019-02-23 MED ORDER — METOPROLOL TARTRATE 25 MG PO TABS
12.5000 mg | ORAL_TABLET | Freq: Two times a day (BID) | ORAL | Status: DC
  Administered 2019-02-23 – 2019-02-24 (×3): 12.5 mg via ORAL
  Filled 2019-02-23 (×3): qty 1

## 2019-02-23 NOTE — Progress Notes (Signed)
[place] at [place] NAME: Stephen Mcknight    MR#:  [id number]  DATE OF BIRTH:  13-Dec-1945  SUBJECTIVE:  The patient is confused. REVIEW OF SYSTEMS:  Limited review of systems due to some confusion Review of Systems  Constitutional: Negative for fever, chills weight loss.  Generalized weakness.    Respiratory: Denies cough, shortness of breath, wheezing  Cardiovascular: Negative for chest pain, palpitations and leg swelling.  Gastrointestinal: Negative for heartburn, abdominal pain, vomiting, diarrhea or consitpation Musculoskeletal: Negative for back pain or joint pain.  Muscle sore. Neurological:   Positive for Parkinson's Psychiatric/Behavioral: Positive confusion DRUG ALLERGIES:  No Known Allergies  VITALS:  Blood pressure (!) 175/88, pulse 63, temperature (!) 97.5 F (36.4 C), temperature source Axillary, resp. rate 18, height 6\' 2"  (1.88 m), weight 60.1 kg, SpO2 100 %.  PHYSICAL EXAMINATION:  Constitutional: Appears with parkinsonian no distress. HENT: Normocephalic. Eyes: Conjunctivae and EOM are normal. PERRLA, no scleral icterus.  Neck: Normal ROM. Neck supple. No JVD. No tracheal deviation. CVS: RRR, S1/S2 +, no murmurs, no gallops, no carotid bruit.  Pulmonary: Effort and breath sounds normal, no stridor, rhonchi, wheezes, rales.  Abdominal: Soft. BS +,  no distension, tenderness, rebound or guarding.  Musculoskeletal: No edema  He has pill-rolling tremor  neuro: Follow-up limited commands. Skin: Skin is warm and dry. No rash noted. Psychiatric: confused but AAO x1. LABORATORY PANEL:   CBC Recent Labs  Lab 02/21/19 0425  WBC 9.8  HGB 11.7*  HCT 34.5*  PLT 189   ------------------------------------------------------------------------------------------------------------------  Chemistries  Recent Labs  Lab 02/20/19 1020  02/23/19 0639  NA 139   < > 138  K 3.9   < > 3.1*  CL 103   < > 107  CO2 26   < > 24   GLUCOSE 140*   < > 88  BUN 32*   < > 12  CREATININE 0.97   < > 0.62  CALCIUM 9.2   < > 8.1*  MG  --    < > 1.8  AST 31  --   --   ALT 30  --   --   ALKPHOS 51  --   --   BILITOT 0.7  --   --    < > = values in this interval not displayed.   ------------------------------------------------------------------------------------------------------------------  Cardiac Enzymes No results for input(s): TROPONINI in the last 168 hours. ------------------------------------------------------------------------------------------------------------------  RADIOLOGY:  [place] Carotid Bilateral  Result Date: 02/22/2019 CLINICAL DATA:  Syncopal episode. History of hypertension and smoking. EXAM: BILATERAL CAROTID DUPLEX ULTRASOUND TECHNIQUE: Pearline Cables scale imaging, color Doppler and duplex ultrasound were performed of bilateral carotid and vertebral arteries in the neck. COMPARISON:  None. FINDINGS: Criteria: Quantification of carotid stenosis is based on velocity parameters that correlate the residual internal carotid diameter with NASCET-based stenosis levels, using the diameter of the distal internal carotid lumen as the denominator for stenosis measurement. The following velocity measurements were obtained: RIGHT ICA: 87/29 cm/sec CCA: 81/85 cm/sec SYSTOLIC ICA/CCA RATIO:  1.2 ECA: 96 cm/sec LEFT ICA: 85/21 cm/sec CCA: 63/14 cm/sec SYSTOLIC ICA/CCA RATIO:  1.2 ECA: 45 cm/sec RIGHT CAROTID ARTERY: There is a moderate to large amount of eccentric echogenic plaque within the right carotid bulb (image 15), extending to involve the origin and proximal aspects of the right internal carotid artery (image 22, not resulting in elevated peak systolic velocities within the interrogated course of the right internal carotid artery to suggest a hemodynamically  significant stenosis. RIGHT VERTEBRAL ARTERY:  Antegrade Flow LEFT CAROTID ARTERY: There is a moderate to large amount of eccentric echogenic plaque within the left carotid  bulb (image 47), extending to involve the origin and proximal aspects of the left internal carotid artery (image 55), not resulting in elevated peak systolic velocities within the interrogated course of the left internal carotid artery to suggest a hemodynamically significant stenosis. LEFT VERTEBRAL ARTERY:  Antegrade Flow IMPRESSION: Moderate to large amount of bilateral atherosclerotic plaque, not resulting in a hemodynamically significant stenosis within either internal carotid artery. Electronically Signed   By: Simonne ComeJohn  Watts M.D.   On: 02/22/2019 09:09     ASSESSMENT AND PLAN:   73 year old male with history of Parkinson's disease who presents to the emergency room after his neighbor found him down at his home.  1.  Rhabdomyolysis due to fall and being found on the ground:   Continue IV fluids.  CK for a.m.  2.  Fall likely related to his underlying Parkinson's disease  Echocardiography is unremarkable.  Carotid duplex report Moderate to large amount of bilateral atherosclerotic plaque, not resulting in a hemodynamically significant stenosis within either internal carotid artery. CT scans as above PT/OT: SNF.  3.   hypertension: PRN hydralazine and labetalol ordered Added norvasc and lopressor.  4.  Parkinson's disease: Continue Sinemet  5.  Hypokalemia: Potassium supplement. Management plans discussed with the patient and he is in agreement. I discussed with patient daughter. CODE STATUS: DNR Patient's daughter Randa EvensJoanne is power of attorney.  TOTAL TIME TAKING CARE OF THIS PATIENT: 26 minutes.  POSSIBLE D/C 2 days, DEPENDING ON CLINICAL CONDITION.  Shaune PollackQing Daejon Lich M.D on 02/23/2019 at 1:19 PM  Between 7am to 6pm - Pager - [phone] After 6pm go to [email or web address] - password [email or web address]  [facility]  Office  [phone]  CC: Primary care physician; Dellia NimsKeplinger, Lynn E, MD  Note: This dictation was prepared with Dragon dictation along with smaller phrase technology.  Any transcriptional errors that result from this process are unintentional.

## 2019-02-24 LAB — BASIC METABOLIC PANEL
Anion gap: 7 (ref 5–15)
BUN: 16 mg/dL (ref 8–23)
CO2: 24 mmol/L (ref 22–32)
Calcium: 8.2 mg/dL — ABNORMAL LOW (ref 8.9–10.3)
Chloride: 108 mmol/L (ref 98–111)
Creatinine, Ser: 0.72 mg/dL (ref 0.61–1.24)
GFR calc Af Amer: >60 mL/min (ref >60)
GFR calc non Af Amer: >60 mL/min (ref >60)
Glucose, Bld: 93 mg/dL (ref 70–99)
Potassium: 3.5 mmol/L (ref 3.5–5.1)
Sodium: 139 mmol/L (ref 135–145)

## 2019-02-24 LAB — SARS CORONAVIRUS 2 BY RT PCR (HOSPITAL ORDER, PERFORMED IN ~~LOC~~ HOSPITAL LAB): SARS Coronavirus 2: NEGATIVE

## 2019-02-24 LAB — CK: Total CK: 451 U/L — ABNORMAL HIGH (ref 49–397)

## 2019-02-24 MED ORDER — AMLODIPINE BESYLATE 10 MG PO TABS: 10.0000 mg | ORAL_TABLET | Freq: Every day | ORAL | Status: AC

## 2019-02-24 MED ORDER — FLEET ENEMA 7-19 GM/118ML RE ENEM
1.0000 | ENEMA | Freq: Every day | RECTAL | Status: DC | PRN
  Administered 2019-02-24: 1 via RECTAL
  Filled 2019-02-24: qty 1

## 2019-02-24 MED ORDER — METOPROLOL TARTRATE 25 MG PO TABS: 12.5000 mg | ORAL_TABLET | Freq: Two times a day (BID) | ORAL | Status: AC

## 2019-02-24 NOTE — TOC Progression Note (Signed)
Transition of Care [facility]) - Progression Note    Patient Details  Name: Amiere Cawley MRN: [id number] Date of Birth: May 11, 1946  Transition of Care [facility]) CM/SW Contact  Shelbie Hutching, RN Phone Number: 02/24/2019, 10:01 AM  Clinical Narrative:    Attempted to reach out to patient's daughter, message left for return call.  Patient is lethargic this morning, speech is very garbled and hard to understand.  [facility] contacted to see if they can offer a bed.   RNCM will follow up later today.   Expected Discharge Plan: [place] Barriers to Discharge: Continued Medical Work up  Expected Discharge Plan and Services Expected Discharge Plan: [place] Choice: [place] arrangements for the past 2 months: Single Family Home                                       Social Determinants of Health (SDOH) Interventions    Readmission Risk Interventions No flowsheet data found.

## 2019-02-24 NOTE — Progress Notes (Signed)
Report called to [facility]. EMS called for transport. Madlyn Frankel, RN

## 2019-02-24 NOTE — Progress Notes (Signed)
Report and pt packet provided to EMS transport service. Packet to be provided with arrival to facility.

## 2019-02-24 NOTE — NC FL2 (Signed)
[place] LEVEL OF CARE SCREENING TOOL     IDENTIFICATION  Patient Name: Stephen Mcknight Birthdate: 1946-04-15 Sex: male Admission Date (Current Location): 02/20/2019  [place] and [place] Number:  Engineering geologist and Address:  [facility], [address] [place], [place] [address]      Provider Number: [id number]  Attending Physician Name and Address:  Demetrios Loll, MD  Relative Name and Phone Number:       Current Level of Care: Hospital Recommended Level of Care: [place] Prior Approval Number:    Date Approved/Denied:   PASRR Number: [id number]  Discharge Plan: SNF    Current Diagnoses: Patient Active Problem List   Diagnosis Date Noted  . Rhabdomyolysis 02/20/2019    Orientation RESPIRATION BLADDER Height & Weight     Self, Place  Normal Incontinent, External catheter Weight: 60.1 kg Height:  6\' 2"  (188 cm)  BEHAVIORAL SYMPTOMS/MOOD NEUROLOGICAL BOWEL NUTRITION STATUS  (Not interactive) (None) Continent Diet(DYS 1)  AMBULATORY STATUS COMMUNICATION OF NEEDS Skin   Extensive Assist Verbally Skin abrasions                       Personal Care Assistance Level of Assistance  Bathing, Feeding, Dressing Bathing Assistance: Maximum assistance Feeding assistance: Limited assistance Dressing Assistance: Maximum assistance     Functional Limitations Info  Sight, Hearing, Speech Sight Info: Adequate Hearing Info: Adequate Speech Info: Impaired(Slurred/dysarthria)    SPECIAL CARE FACTORS FREQUENCY  PT (By licensed PT), OT (By licensed OT), Speech therapy     PT Frequency: 5 x week OT Frequency: 5 x week     Speech Therapy Frequency: 5 x week      Contractures Contractures Info: Not present    Additional Factors Info  Code Status, Allergies Code Status Info: DNR Allergies Info: NKDA           Current Medications (02/24/2019):  This is the current hospital active medication list Current  Facility-Administered Medications  Medication Dose Route Frequency Provider Last Rate Last Dose  . 0.9 %  sodium chloride infusion   Intravenous Continuous Demetrios Loll, MD 75 mL/hr at 02/24/19 0229    . amLODipine (NORVASC) tablet 10 mg  10 mg Oral Daily Bettey Costa, MD   10 mg at 02/23/19 1114  . aspirin EC tablet 81 mg  81 mg Oral Daily Lang Snow, NP   81 mg at 02/23/19 1114  . bisacodyl (DULCOLAX) EC tablet 5 mg  5 mg Oral Daily PRN Demetrios Loll, MD      . carbidopa-levodopa (SINEMET IR) 25-100 MG per tablet immediate release 2 tablet  2 tablet Oral 5 X Daily Lang Snow, NP   2 tablet at 02/24/19 0541  . docusate sodium (COLACE) capsule 100 mg  100 mg Oral BID PRN Lang Snow, NP      . enoxaparin (LOVENOX) injection 40 mg  40 mg Subcutaneous Q24H Lang Snow, NP   40 mg at 02/23/19 2210  . feeding supplement (ENSURE ENLIVE) (ENSURE ENLIVE) liquid 237 mL  237 mL Oral BID BM Mody, Sital, MD   237 mL at 02/23/19 1340  . hydrALAZINE (APRESOLINE) injection 10 mg  10 mg Intravenous Q6H PRN Bettey Costa, MD   10 mg at 02/21/19 1109  . labetalol (NORMODYNE) injection 10 mg  10 mg Intravenous Q6H PRN Bettey Costa, MD   10 mg at 02/21/19 1015  . MEDLINE mouth rinse  15 mL  Mouth Rinse BID Adrian SaranMody, Sital, MD   15 mL at 02/23/19 2215  . methocarbamol (ROBAXIN) tablet 1,000 mg  1,000 mg Oral QHS PRN Jimmye Normanuma, Elizabeth Achieng, NP      . metoprolol tartrate (LOPRESSOR) tablet 12.5 mg  12.5 mg Oral BID Shaune Pollackhen, Qing, MD   12.5 mg at 02/23/19 2211  . mirtazapine (REMERON) tablet 15 mg  15 mg Oral QHS Jimmye Normanuma, Elizabeth Achieng, NP   15 mg at 02/23/19 2211  . multivitamin with minerals tablet 1 tablet  1 tablet Oral Daily Jimmye Normanuma, Elizabeth Achieng, NP   1 tablet at 02/23/19 1114  . oxybutynin (DITROPAN) tablet 5 mg  5 mg Oral QHS Jimmye Normanuma, Elizabeth Achieng, NP   5 mg at 02/23/19 2210  . pantoprazole (PROTONIX) EC tablet 40 mg  40 mg Oral Daily Jimmye Normanuma, Elizabeth Achieng, NP   40 mg at  02/23/19 1114  . pramipexole (MIRAPEX) tablet 0.5 mg  0.5 mg Oral BID Jimmye Normanuma, Elizabeth Achieng, NP   0.5 mg at 02/23/19 2210     Discharge Medications: Please see discharge summary for a list of discharge medications.  Relevant Imaging Results:  Relevant Lab Results:   Additional Information SS#: [id number]  Allayne ButcherJeanna M Harmonii Karle, RN

## 2019-02-24 NOTE — TOC Progression Note (Signed)
Transition of Care [facility]) - Progression Note    Patient Details  Name: Rishan Oyama MRN: [id number] Date of Birth: 1946-06-07  [facility] CM/SW Contact  Shelbie Hutching, RN Phone Number: 02/24/2019, 12:38 PM  Clinical Narrative:    RNCM was able to speak with patient's daughter about SNF placement.  [facility] is unable to offer a bed at this time.  Daughter chooses [facility] in [place].  [facility] will be able to accept patient today he does need new COVID test with negative result before discharge to facility.    Expected Discharge Plan: [place] Barriers to Discharge: Continued Medical Work up  Expected Discharge Plan and Services Expected Discharge Plan: [place] Choice: [place] arrangements for the past 2 months: Single Family Home Expected Discharge Date: 02/24/19                                     Social Determinants of Health (SDOH) Interventions    Readmission Risk Interventions No flowsheet data found.

## 2019-02-24 NOTE — Care Management Important Message (Signed)
Important Message  Patient Details  Name: Stephen Mcknight MRN: [id number] Date of Birth: 1946/01/20   Medicare Important Message Given:  Yes     Juliann Pulse A Tearah Saulsbury 02/24/2019, 11:28 AM

## 2019-02-24 NOTE — Discharge Summary (Signed)
[facility] NAME: Stephen Mcknight    MR#:  [id number]  DATE OF BIRTH:  Jul 14, 1946  DATE OF ADMISSION:  02/20/2019   ADMITTING PHYSICIAN: Lang Snow, NP  DATE OF DISCHARGE: 02/24/2019  PRIMARY CARE PHYSICIAN: Ronnie Doss, MD   ADMISSION DIAGNOSIS:  Fall [W19.XXXA] Non-traumatic rhabdomyolysis [M62.82] DISCHARGE DIAGNOSIS:  Active Problems:   Rhabdomyolysis  SECONDARY DIAGNOSIS:   Past Medical History:  Diagnosis Date   Hypertension    Parkinson disease [facility])    HOSPITAL COURSE:  73 year old male with history of Parkinson's disease who presents to the emergency room after his neighbor found him down at his home.  1.  Rhabdomyolysis due to fall and being found on the ground:  Improving with IV fluids.  2.  Fall likely related to his underlying Parkinson's disease  Echocardiography is unremarkable.  Carotid duplex report Moderate to large amount of bilateral atherosclerotic plaque, not resulting in a hemodynamically significant stenosis within either internal carotid artery. CT scans as above PT/OT: SNF.  3.   hypertension: PRN hydralazine and labetalol ordered Added norvasc and lopressor.  4.  Parkinson's disease: Continue Sinemet  5.  Hypokalemia: Improved with potassium supplement.  DISCHARGE CONDITIONS:  Stable, discharge to skilled nursing facility today. CONSULTS OBTAINED:   DRUG ALLERGIES:  No Known Allergies DISCHARGE MEDICATIONS:   Allergies as of 02/24/2019   No Known Allergies     Medication List    TAKE these medications   amLODipine 10 MG tablet Commonly known as: NORVASC Take 1 tablet (10 mg total) by mouth daily.   aspirin EC 81 MG tablet Take 81 mg by mouth daily.   carbidopa-levodopa 25-100 MG tablet Commonly known as: SINEMET IR Take 2 tablets by mouth 5 (five) times daily.   docusate sodium 100 MG capsule Commonly known as: COLACE Take 100 mg by mouth 2  (two) times daily as needed for mild constipation or moderate constipation.   methocarbamol 500 MG tablet Commonly known as: ROBAXIN Take 1,000 mg by mouth at bedtime as needed for muscle spasms.   metoprolol tartrate 25 MG tablet Commonly known as: LOPRESSOR Take 0.5 tablets (12.5 mg total) by mouth 2 (two) times daily.   mirtazapine 15 MG tablet Commonly known as: REMERON Take 15 mg by mouth at bedtime.   multivitamin capsule Take 1 capsule by mouth daily.   omeprazole 20 MG capsule Commonly known as: PRILOSEC Take 40 mg by mouth daily.   oxybutynin 5 MG tablet Commonly known as: DITROPAN Take 5 mg by mouth at bedtime.   pramipexole 1 MG tablet Commonly known as: MIRAPEX Take 0.5 mg by mouth 2 (two) times daily.        DISCHARGE INSTRUCTIONS:  See AVS.  If you experience worsening of your admission symptoms, develop shortness of breath, life threatening emergency, suicidal or homicidal thoughts you must seek medical attention immediately by calling 911 or calling your MD immediately  if symptoms less severe.  You Must read complete instructions/literature along with all the possible adverse reactions/side effects for all the Medicines you take and that have been prescribed to you. Take any new Medicines after you have completely understood and accpet all the possible adverse reactions/side effects.   Please note  You were cared for by a hospitalist during your hospital stay. If you have any questions about your discharge medications or the care you received while you were in the hospital after you are discharged, you can  call the unit and asked to speak with the hospitalist on call if the hospitalist that took care of you is not available. Once you are discharged, your primary care physician will handle any further medical issues. Please note that NO REFILLS for any discharge medications will be authorized once you are discharged, as it is imperative that you return to  your primary care physician (or establish a relationship with a primary care physician if you do not have one) for your aftercare needs so that they can reassess your need for medications and monitor your lab values.    On the day of Discharge:  VITAL SIGNS:  Blood pressure (!) 142/74, pulse 60, temperature 98.2 F (36.8 C), temperature source Oral, resp. rate 16, height 6\' 2"  (1.88 m), weight 60.1 kg, SpO2 98 %. PHYSICAL EXAMINATION:  GENERAL:  73 y.o.-year-old patient lying in the bed with no acute distress.  EYES: Pupils equal, round, reactive to light and accommodation. No scleral icterus. Extraocular muscles intact.  HEENT: Head atraumatic, normocephalic.   NECK:  Supple, no jugular venous distention. No thyroid enlargement, no tenderness.  LUNGS: Normal breath sounds bilaterally, no wheezing, rales,rhonchi or crepitation. No use of accessory muscles of respiration.  CARDIOVASCULAR: S1, S2 normal. No murmurs, rubs, or gallops.  ABDOMEN: Soft, non-tender, non-distended. Bowel sounds present. No organomegaly or mass.  EXTREMITIES: No pedal edema, cyanosis, or clubbing.  NEUROLOGIC: He does not follow commands, unable to exam. PSYCHIATRIC: The patient demented. SKIN: No obvious rash, lesion, or ulcer.  DATA REVIEW:   CBC Recent Labs  Lab 02/21/19 0425  WBC 9.8  HGB 11.7*  HCT 34.5*  PLT 189    Chemistries  Recent Labs  Lab 02/20/19 1020  02/23/19 0639 02/24/19 0428  NA 139   < > 138 139  K 3.9   < > 3.1* 3.5  CL 103   < > 107 108  CO2 26   < > 24 24  GLUCOSE 140*   < > 88 93  BUN 32*   < > 12 16  CREATININE 0.97   < > 0.62 0.72  CALCIUM 9.2   < > 8.1* 8.2*  MG  --    < > 1.8  --   AST 31  --   --   --   ALT 30  --   --   --   ALKPHOS 51  --   --   --   BILITOT 0.7  --   --   --    < > = values in this interval not displayed.     Microbiology Results  Results for orders placed or performed during the hospital encounter of 02/20/19  SARS Coronavirus 2 (CEPHEID  - Performed in [facility] hospital lab), Hosp Order     Status: None   Collection Time: 02/20/19 11:35 AM   Specimen: Urine, Catheterized; Nasopharyngeal  Result Value Ref Range Status   SARS Coronavirus 2 NEGATIVE NEGATIVE Final    Comment: (NOTE) If result is NEGATIVE SARS-CoV-2 target nucleic acids are NOT DETECTED. The SARS-CoV-2 RNA is generally detectable in upper and lower  respiratory specimens during the acute phase of infection. The lowest  concentration of SARS-CoV-2 viral copies this assay can detect is 250  copies / mL. A negative result does not preclude SARS-CoV-2 infection  and should not be used as the sole basis for treatment or other  patient management decisions.  A negative result may occur with  improper specimen collection /  handling, submission of specimen other  than nasopharyngeal swab, presence of viral mutation(s) within the  areas targeted by this assay, and inadequate number of viral copies  (<250 copies / mL). A negative result must be combined with clinical  observations, patient history, and epidemiological information. If result is POSITIVE SARS-CoV-2 target nucleic acids are DETECTED. The SARS-CoV-2 RNA is generally detectable in upper and lower  respiratory specimens dur ing the acute phase of infection.  Positive  results are indicative of active infection with SARS-CoV-2.  Clinical  correlation with patient history and other diagnostic information is  necessary to determine patient infection status.  Positive results do  not rule out bacterial infection or co-infection with other viruses. If result is PRESUMPTIVE POSTIVE SARS-CoV-2 nucleic acids MAY BE PRESENT.   A presumptive positive result was obtained on the submitted specimen  and confirmed on repeat testing.  While 2019 novel coronavirus  (SARS-CoV-2) nucleic acids may be present in the submitted sample  additional confirmatory testing may be necessary for epidemiological  and / or  clinical management purposes  to differentiate between  SARS-CoV-2 and other Sarbecovirus currently known to infect humans.  If clinically indicated additional testing with an alternate test  methodology [phone] is advised. The SARS-CoV-2 RNA is generally  detectable in upper and lower respiratory sp ecimens during the acute  phase of infection. The expected result is Negative. Fact Sheet for Patients:  BoilerBrush.com.cyhttps://www.fda.gov/media/136312/download Fact Sheet for Healthcare Providers: https://pope.com/https://www.fda.gov/media/136313/download This test is not yet approved or cleared by the [place] FDA and has been authorized for detection and/or diagnosis of SARS-CoV-2 by FDA under an Emergency Use Authorization (EUA).  This EUA will remain in effect (meaning this test can be used) for the duration of the COVID-19 declaration under Section 564(b)(1) of the Act, 21 U.S.C. section 360bbb-3(b)(1), unless the authorization is terminated or revoked sooner. Performed at [facility], [address] [place], [place] [address]     RADIOLOGY:  No results found.   Management plans discussed with the patient, family and they are in agreement.  CODE STATUS: DNR   TOTAL TIME TAKING CARE OF THIS PATIENT: 33 minutes.    Shaune PollackQing Alvie Speltz M.D on 02/24/2019 at 12:50 PM  Between 7am to 6pm - Pager - [phone]  After 6pm go to [email or web address] - Social research officer, governmentpassword EPAS [facility]  [facility]  Office  [phone]  CC: Primary care physician; Dellia NimsKeplinger, Lynn E, MD   Note: This dictation was prepared with Dragon dictation along with smaller phrase technology. Any transcriptional errors that result from this process are unintentional.
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
## 2019-02-24 NOTE — TOC Transition Note (Addendum)
Transition of Care [facility]) - CM/SW Discharge Note   Patient Details  Name: Stephen Mcknight MRN: [id number] Date of Birth: 1945-10-29  Transition of Care [facility]) CM/SW Contact:  Shelbie Hutching, RN Phone Number: 02/24/2019, 2:40 PM   Clinical Narrative:    Janeece Riggers contacted RNCM and due to insurance issue they are unable to accept patient.  Patient's daughter contacted and she is okay with patient going to [facility].  Patient can go today as soon as COVID results are back. Patient is going to room 30 B- bedside RN to call report to [phone].  Final next level of care: Skilled Nursing Facility Barriers to Discharge: Barriers Resolved   Patient Goals and CMS Choice Patient states their goals for this hospitalization and ongoing recovery are:: Patient not fully oriented. CMS Medicare.gov Compare Post Acute Care list provided to:: Patient Represenative (must comment)(Daughter Joann) Choice offered to / list presented to : Adult Children  Discharge Placement              Patient chooses bed at: [facility] Patient to be transferred to facility by: [facility] Name of family member notified: Arville Go- daughter Patient and family notified of of transfer: 02/24/19  Discharge Plan and Services     Post Acute Care Choice: [place]                               Social Determinants of Health (SDOH) Interventions     Readmission Risk Interventions No flowsheet data found.

## 2019-09-17 ENCOUNTER — Other Ambulatory Visit: Payer: Self-pay

## 2019-09-17 ENCOUNTER — Emergency Department
Admission: EM | Admit: 2019-09-17 | Discharge: 2019-09-22 | Disposition: A | Discharge status: Home / Self Care | Payer: Medicare Other | Attending: Emergency Medicine | Admitting: Emergency Medicine

## 2019-09-17 ENCOUNTER — Emergency Department: Payer: Medicare Other

## 2019-09-17 DIAGNOSIS — F0280 Dementia in other diseases classified elsewhere without behavioral disturbance: Secondary | ICD-10-CM | POA: Insufficient documentation

## 2019-09-17 DIAGNOSIS — R4689 Other symptoms and signs involving appearance and behavior: Secondary | ICD-10-CM

## 2019-09-17 DIAGNOSIS — Z20822 Contact with and (suspected) exposure to covid-19: Secondary | ICD-10-CM | POA: Diagnosis not present

## 2019-09-17 DIAGNOSIS — G20 Parkinson's disease: Secondary | ICD-10-CM | POA: Insufficient documentation

## 2019-09-17 DIAGNOSIS — Z5189 Encounter for other specified aftercare: Secondary | ICD-10-CM

## 2019-09-17 DIAGNOSIS — Z7982 Long term (current) use of aspirin: Secondary | ICD-10-CM | POA: Diagnosis not present

## 2019-09-17 DIAGNOSIS — Z4800 Encounter for change or removal of nonsurgical wound dressing: Secondary | ICD-10-CM | POA: Diagnosis present

## 2019-09-17 DIAGNOSIS — L89152 Pressure ulcer of sacral region, stage 2: Secondary | ICD-10-CM | POA: Insufficient documentation

## 2019-09-17 DIAGNOSIS — Z79899 Other long term (current) drug therapy: Secondary | ICD-10-CM | POA: Diagnosis not present

## 2019-09-17 DIAGNOSIS — I10 Essential (primary) hypertension: Secondary | ICD-10-CM | POA: Insufficient documentation

## 2019-09-17 DIAGNOSIS — F17210 Nicotine dependence, cigarettes, uncomplicated: Secondary | ICD-10-CM | POA: Diagnosis not present

## 2019-09-17 DIAGNOSIS — R4182 Altered mental status, unspecified: Secondary | ICD-10-CM | POA: Insufficient documentation

## 2019-09-17 HISTORY — DX: Unspecified dementia, unspecified severity, without behavioral disturbance, psychotic disturbance, mood disturbance, and anxiety: F03.90

## 2019-09-17 LAB — CBC WITH DIFFERENTIAL/PLATELET
Abs Immature Granulocytes: 0.03 10*3/uL (ref 0.00–0.07)
Basophils Absolute: 0 10*3/uL (ref 0.0–0.1)
Basophils Relative: 0 %
Eosinophils Absolute: 0.1 10*3/uL (ref 0.0–0.5)
Eosinophils Relative: 1 %
HCT: 37.3 % — ABNORMAL LOW (ref 39.0–52.0)
Hemoglobin: 12.2 g/dL — ABNORMAL LOW (ref 13.0–17.0)
Immature Granulocytes: 0 %
Lymphocytes Relative: 23 %
Lymphs Abs: 2 10*3/uL (ref 0.7–4.0)
MCH: 29.7 pg (ref 26.0–34.0)
MCHC: 32.7 g/dL (ref 30.0–36.0)
MCV: 90.8 fL (ref 80.0–100.0)
Monocytes Absolute: 0.5 10*3/uL (ref 0.1–1.0)
Monocytes Relative: 6 %
Neutro Abs: 6.2 10*3/uL (ref 1.7–7.7)
Neutrophils Relative %: 70 %
Platelets: 275 10*3/uL (ref 150–400)
RBC: 4.11 MIL/uL — ABNORMAL LOW (ref 4.22–5.81)
RDW: 13 % (ref 11.5–15.5)
WBC: 8.8 10*3/uL (ref 4.0–10.5)
nRBC: 0 % (ref 0.0–0.2)

## 2019-09-17 LAB — COMPREHENSIVE METABOLIC PANEL
ALT: 5 U/L (ref 0–44)
AST: 14 U/L — ABNORMAL LOW (ref 15–41)
Albumin: 3.6 g/dL (ref 3.5–5.0)
Alkaline Phosphatase: 61 U/L (ref 38–126)
Anion gap: 7 (ref 5–15)
BUN: 17 mg/dL (ref 8–23)
CO2: 30 mmol/L (ref 22–32)
Calcium: 8.8 mg/dL — ABNORMAL LOW (ref 8.9–10.3)
Chloride: 102 mmol/L (ref 98–111)
Creatinine, Ser: 0.77 mg/dL (ref 0.61–1.24)
GFR calc Af Amer: >60 mL/min (ref >60)
GFR calc non Af Amer: >60 mL/min (ref >60)
Glucose, Bld: 128 mg/dL — ABNORMAL HIGH (ref 70–99)
Potassium: 4.1 mmol/L (ref 3.5–5.1)
Sodium: 139 mmol/L (ref 135–145)
Total Bilirubin: 0.5 mg/dL (ref 0.3–1.2)
Total Protein: 7 g/dL (ref 6.5–8.1)

## 2019-09-17 MED ORDER — SODIUM CHLORIDE 0.9 % IV BOLUS
500.0000 mL | Freq: Once | INTRAVENOUS | Status: AC
  Administered 2019-09-18: 500 mL via INTRAVENOUS

## 2019-09-17 MED ORDER — PRAMIPEXOLE DIHYDROCHLORIDE 0.25 MG PO TABS
0.5000 mg | ORAL_TABLET | Freq: Three times a day (TID) | ORAL | Status: DC
  Administered 2019-09-18 – 2019-09-22 (×11): 0.5 mg via ORAL
  Filled 2019-09-17 (×17): qty 2

## 2019-09-17 MED ORDER — CARBIDOPA-LEVODOPA 25-100 MG PO TABS
1.0000 | ORAL_TABLET | Freq: Three times a day (TID) | ORAL | Status: DC
  Administered 2019-09-18 – 2019-09-22 (×12): 1 via ORAL
  Filled 2019-09-17 (×17): qty 1

## 2019-09-17 MED ORDER — DOCUSATE SODIUM 100 MG PO CAPS
100.0000 mg | ORAL_CAPSULE | Freq: Two times a day (BID) | ORAL | Status: DC | PRN
  Administered 2019-09-20: 10:00:00 100 mg via ORAL
  Filled 2019-09-17: qty 1

## 2019-09-17 NOTE — ED Notes (Signed)
This nurse spoke to Stephen Mcknight with Department of Social Service. Patient was placed under their care yesterday due to home situation. Patient is a hospice patient with [facility] and a [facility]. Patient is in process of attempting to be placed in hotel with care giver for 24/7 care but cannot go back to home situation. Patient was sent to ED due to severe pain and discovery of decubitus ulcers on the sacrum.

## 2019-09-17 NOTE — ED Notes (Signed)
This nurse placed mepilex on patients sacrum for wound care. Patient rolled onto left side for relief of reddness noted on right hip.
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
## 2019-09-18 LAB — URINALYSIS, COMPLETE (UACMP) WITH MICROSCOPIC
Bacteria, UA: NONE SEEN
Bilirubin Urine: NEGATIVE
Glucose, UA: NEGATIVE mg/dL
Hgb urine dipstick: NEGATIVE
Ketones, ur: 5 mg/dL — AB
Leukocytes,Ua: NEGATIVE
Nitrite: NEGATIVE
Protein, ur: NEGATIVE mg/dL
Specific Gravity, Urine: 1.027 (ref 1.005–1.030)
Squamous Epithelial / HPF: NONE SEEN (ref 0–5)
WBC, UA: NONE SEEN WBC/hpf (ref 0–5)
pH: 5 (ref 5.0–8.0)

## 2019-09-18 LAB — URINE CULTURE: Culture: <10000 — AB

## 2019-09-18 LAB — SARS CORONAVIRUS 2 (TAT 6-24 HRS): SARS Coronavirus 2: NEGATIVE

## 2019-09-18 MED ORDER — MUPIROCIN CALCIUM 2 % EX CREA
TOPICAL_CREAM | Freq: Every day | CUTANEOUS | Status: AC
  Administered 2019-09-20: 1 via TOPICAL
  Filled 2019-09-18: qty 15
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
## 2019-09-18 NOTE — ED Provider Notes (Signed)
Vitals:   09/18/19 0500 09/18/19 0638  BP: 136/82   Pulse: (!) 57 (!) 53  Resp:  15  Temp:  97.7 F (36.5 C)  SpO2: 100% 100%    Patient resting. Seen by physical therapy. Awaiting further plan/coordiantion from transition of care team.   Sharyn Creamer, MD 09/18/19 [phone]

## 2019-09-18 NOTE — TOC Progression Note (Signed)
Transition of Care [facility]) - Progression Note    Patient Details  Name: Stephen Mcknight MRN: [id number] Date of Birth: 09-Jan-1946  Transition of Care [facility]) CM/SW Contact  [name], [place] Phone Number: 09/18/2019, 10:28 AM  Clinical Narrative:     Left voicemail for daughter Stephen Mcknight [phone], waiting for return call.       Expected Discharge Plan and Services                                                 Social Determinants of Health (SDOH) Interventions    Readmission Risk Interventions No flowsheet data found.

## 2019-09-18 NOTE — ED Provider Notes (Signed)
-----------------------------------------   12:50 AM on 09/18/2019 -----------------------------------------   Blood pressure 129/80, pulse (!) 59, temperature 97.8 F (36.6 C), temperature source Oral, resp. rate 16, height 1.778 m (5\' 10" ), weight 83.9 kg, SpO2 100 %.  The patient is calm at this time.  Urinalysis unremarkable.  No indication for admission.  Consults for evaluation for SNF placement.   , MD 09/18/19 [phone]

## 2019-09-18 NOTE — ED Notes (Signed)
Patient assisted to eat some breakfast. Took several bites of pancakes and oatmeal with assistance. No swallowing trouble noted. Able to drink 360 ml of apple juice through straw without difficulty.

## 2019-09-18 NOTE — TOC Progression Note (Addendum)
Transition of Care [facility]) - Progression Note    Patient Details  Name: Stephen Mcknight MRN: [id number] Date of Birth: 1946/02/04  Transition of Care [facility]) CM/SW Contact  [name], [place] Phone Number:  09/18/2019, 2:25 PM  Clinical Narrative:     Spoke with Max Fickle DSS/APS [phone] to inform her the patient will not be admitted and will need SNF placement.  Ms. Pecola Leisure stated she will begin the search on her end.  She will also inform [name] DSS/APS [phone], patient's guardian.  This SW attempted to contact patient's guardian but she has a full voicemail and I was unable to leave a message.  Ms. Pecola Leisure recommends [place], [facility], [facility].  Patient's family does want him to go to [facility].  Expected Discharge Plan: Skilled Nursing Facility    Expected Discharge Plan and Services Expected Discharge Plan: Skilled Nursing Facility       Living arrangements for the past 2 months: Mobile Home                                       Social Determinants of Health (SDOH) Interventions    Readmission Risk Interventions No flowsheet data found.
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
## 2019-09-18 NOTE — ED Notes (Signed)
Pt with set up and assist for lunch, pt sleepy but easily awakened. Pt incontinent moderate soft stool, bedding and brief changed. Sacral dressing removed, sacral decubitus cleaned with sterile NS, sacral dressing applied over 2 inch by 2 inch decubitus, area around wound appears to be slightly macerated and skin is moist, no signs of infection, no drainage. Pt tolerated well.

## 2019-09-18 NOTE — ED Notes (Signed)
Patient was rounded on by this nurse and was found to have eaten approx 1/2 of dinner tray. Patient threw rest of tray of food around the room into the floor
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
## 2019-09-18 NOTE — TOC Progression Note (Signed)
Transition of Care [facility]) - Progression Note    Patient Details  Name: Stephen Mcknight MRN: [id number] Date of Birth: 1945-10-23  Transition of Care [facility]) CM/SW Contact  [name], [place] Phone Number:  09/18/2019, 10:33 AM  Clinical Narrative:     Patient's had DSS guardian.  Contacted DSS/APS and left a voicemail to find out who the guardian is.  Waiting for a return call.       Expected Discharge Plan and Services                                                 Social Determinants of Health (SDOH) Interventions    Readmission Risk Interventions No flowsheet data found.

## 2019-09-18 NOTE — ED Notes (Signed)
Patient denies hunger at this time, will pass on to day shift to order diet

## 2019-09-18 NOTE — ED Notes (Signed)
Readjusted patient in bed, brief is dry and external catheter pouch intact. Patient has sacral foam, turned to L side to decrease pressure. Patient denies any pain.  Patient has not voided much since arrival to ER, this RN bladder scanned patient which revealed 34mL in bladder. Will relay to day shift and encourage oral intake

## 2019-09-18 NOTE — NC FL2 (Signed)
[place] MEDICAID FL2 LEVEL OF CARE SCREENING TOOL     IDENTIFICATION  Patient Name: Stephen Mcknight Birthdate: 02/26/1946 Sex: male Admission Date (Current Location): 09/17/2019  [place] and [place] Number:  Chiropodist and Address:  [facility], [address], [place], [place] [address]      Provider Number: [id number]  Attending Physician Name and Address:  Sharyn Creamer, MD  Relative Name and Phone Number:  [name], DSS/APS [phone], legal guardian    Current Level of Care: Home Recommended Level of Care: Skilled Nursing Facility, Memory Care Prior Approval Number:    Date Approved/Denied:   PASRR Number: [id number]  Discharge Plan: SNF    Current Diagnoses: Patient Active Problem List   Diagnosis Date Noted  . Rhabdomyolysis 02/20/2019    Orientation RESPIRATION BLADDER Height & Weight     Self  Normal External catheter Weight: 185 lb (83.9 kg) Height:  5\' 10"  (177.8 cm)  BEHAVIORAL SYMPTOMS/MOOD NEUROLOGICAL BOWEL NUTRITION STATUS  Other (Comment)   Incontinent Diet  AMBULATORY STATUS COMMUNICATION OF NEEDS Skin   Total Care Non-Verbally Other (Comment)(Deep tissue injury on coccyx.)                       Personal Care Assistance Level of Assistance  Total care       Total Care Assistance: Maximum assistance   Functional Limitations Info             SPECIAL CARE FACTORS FREQUENCY                       Contractures      Additional Factors Info                  Current Medications (09/18/2019):  This is the current hospital active medication list Current Facility-Administered Medications  Medication Dose Route Frequency Provider Last Rate Last Admin  . carbidopa-levodopa (SINEMET IR) 25-100 MG per tablet immediate release 1 tablet  1 tablet Oral TID 09/20/2019., MD   1 tablet at 09/18/19 1421  . docusate sodium (COLACE) capsule 100 mg  100 mg Oral BID PRN 09/20/19., MD       . mupirocin cream (BACTROBAN) 2 %   Topical Daily       . pramipexole (MIRAPEX) tablet 0.5 mg  0.5 mg Oral TID Miguel Aschoff., MD   0.5 mg at 09/18/19 1422   Current Outpatient Medications  Medication Sig Dispense Refill  . atorvastatin (LIPITOR) 20 MG tablet Take 20 mg by mouth daily.    . carbidopa-levodopa (SINEMET IR) 25-100 MG tablet Take 2 tablets by mouth 5 (five) times daily.    09/20/19 HYDROcodone-acetaminophen (NORCO) 10-325 MG tablet Take 1 tablet by mouth every 6 (six) hours as needed.    Marland Kitchen LORazepam (ATIVAN) 0.5 MG tablet Take 0.5 mg by mouth every 4 (four) hours as needed.    . methocarbamol (ROBAXIN) 500 MG tablet Take 1,000 mg by mouth at bedtime as needed for muscle spasms.     . mirtazapine (REMERON) 15 MG tablet Take 15 mg by mouth at bedtime.     . Multiple Vitamin (MULTIVITAMIN) capsule Take 1 capsule by mouth daily.    Marland Kitchen omeprazole (PRILOSEC) 20 MG capsule Take 40 mg by mouth daily.     Marland Kitchen oxybutynin (DITROPAN) 5 MG tablet Take 5 mg by mouth at bedtime.    . pramipexole (MIRAPEX) 0.5 MG tablet Take  0.5 mg by mouth 3 (three) times daily.    Marland Kitchen amLODipine (NORVASC) 10 MG tablet Take 1 tablet (10 mg total) by mouth daily.    Marland Kitchen aspirin EC 81 MG tablet Take 81 mg by mouth daily.    Marland Kitchen docusate sodium (COLACE) 100 MG capsule Take 100 mg by mouth 2 (two) times daily as needed for mild constipation or moderate constipation.     . metoprolol tartrate (LOPRESSOR) 25 MG tablet Take 0.5 tablets (12.5 mg total) by mouth 2 (two) times daily.       Discharge Medications: Please see discharge summary for a list of discharge medications.  Relevant Imaging Results:  Relevant Lab Results:   Additional [place] DSS/APS is patient's legal guardian; Bridgette Habermann [phone]  Adelene Amas, LCSWA

## 2019-09-18 NOTE — ED Notes (Signed)
Received report from [place], [place]. Patient is currently asleep, NAD. Vitals are stable, patient does not have any complaints
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
## 2019-09-19 NOTE — ED Notes (Signed)
Pt fed applesauce at this time after giving medications. Pt repositioned and comfortable, patient suction canister emptied. Will continue to monitor.

## 2019-09-19 NOTE — TOC Progression Note (Addendum)
Transition of Care [facility]) - Progression Note    Patient Details  Name: Stephen Mcknight MRN: [id number] Date of Birth: 07-21-1946  Transition of Care [facility]) CM/SW Contact  [place] Cellar, RN Phone Number: 09/19/2019, 2:06 PM  Clinical Narrative:    Incoming call from [facility] following up on patient. Advised patient was planning placement in memory care unit. Waiting for bed offer. Tiffany from [place] in to interview patient and deemed patient not appropriate due to lack of mobility.    Expected Discharge Plan: Skilled Nursing Facility    Expected Discharge Plan and Services Expected Discharge Plan: Skilled Nursing Facility     Post Acute Care Choice: Skilled Nursing Facility Living arrangements for the past 2 months: Mobile Home                                       Social Determinants of Health (SDOH) Interventions    Readmission Risk Interventions No flowsheet data found.

## 2019-09-19 NOTE — ED Notes (Signed)
Pt seems uncomfortable at this time. This RN changed the patients chuck, brief, gown, and blankets. Pt is clean/dry at this time. Pt positioned on left side down with pillows. Pt appears more comfortable at this time. Will continue to monitor.

## 2019-09-19 NOTE — TOC Progression Note (Addendum)
Transition of Care [facility]) - Progression Note    Patient Details  Name: Stephen Mcknight MRN: [id number] Date of Birth: [birth year]  Transition of Care [facility]) CM/SW Contact  [place] Cellar, RN Phone Number: 09/19/2019, 12:17 PM  Clinical Narrative:    Sherron Monday with Max Fickle DSS/APS [phone] requesting clarification on placement. Inetta Fermo advised they had spoken to [place] and Duard Larsen was willing to accept patient for placement. RN CM outreached to [place] and faxed Fl2, progress notes, and COVID testing . Advised finances are managed through [employer].    Expected Discharge Plan: Skilled Nursing Facility    Expected Discharge Plan and Services Expected Discharge Plan: Skilled Nursing Facility     Post Acute Care Choice: Skilled Nursing Facility Living arrangements for the past 2 months: Mobile Home                                       Social Determinants of Health (SDOH) Interventions    Readmission Risk Interventions No flowsheet data found.

## 2019-09-19 NOTE — TOC Progression Note (Signed)
Transition of Care [facility]) - Progression Note    Patient Details  Name: [name] MRN: [id number] Date of Birth: [birth year]  Transition of Care [facility]) CM/SW Contact  [place] Cellar, RN Phone Number: 09/19/2019, 12:06 PM  Clinical Narrative:    Attempted to call [name] DSS/APS [phone], patient's guardian. Attempted to contact patient's guardian but she has a full voicemail and unable to leave a message. Sent secure email requesting callback.    Expected Discharge Plan: Skilled Nursing Facility    Expected Discharge Plan and Services Expected Discharge Plan: Skilled Nursing Facility     Post Acute Care Choice: Skilled Nursing Facility Living arrangements for the past 2 months: Mobile Home                                       Social Determinants of Health (SDOH) Interventions    Readmission Risk Interventions No flowsheet data found.

## 2019-09-19 NOTE — ED Notes (Signed)
Pt assessed at this time, brief is dry/clean. Pt fed by this nurse lunch. Pt tolerated well. Will continue to monitor.

## 2019-09-19 NOTE — ED Notes (Addendum)
Pt lying on left side, head on two pillows, pt awake and alert, pt declining interventions, pt does not have mask on, denies need to toilet pt given call bell light

## 2019-09-19 NOTE — TOC Progression Note (Signed)
Transition of Care [facility]) - Progression Note    Patient Details  Name: Stephen Mcknight MRN: [id number] Date of Birth: [birth year]  Transition of Care [facility]) CM/SW Contact  [place] Cellar, RN Phone Number: 09/19/2019, 3:49 PM  Clinical Narrative:    Incoming call from [place], [place] @ [facility] [phone] requesting update on discharge disposition. Updated with bed search in place.    Expected Discharge Plan: Skilled Nursing Facility    Expected Discharge Plan and Services Expected Discharge Plan: Skilled Nursing Facility     Post Acute Care Choice: Skilled Nursing Facility Living arrangements for the past 2 months: Mobile Home                                       Social Determinants of Health (SDOH) Interventions    Readmission Risk Interventions No flowsheet data found.

## 2019-09-19 NOTE — ED Notes (Signed)
Pt lying in bed asleep eyes closed with equal unlabored RR. VS WNL. Nothing needed from staff at this time

## 2019-09-19 NOTE — ED Provider Notes (Signed)
-----------------------------------------   3:41 AM on 09/19/2019 -----------------------------------------   Blood pressure (!) 154/81, pulse (!) 56, temperature 97.7 F (36.5 C), temperature source Oral, resp. rate 16, height 1.778 m (5\' 10" ), weight 83.9 kg, SpO2 100 %.  The patient is calm and cooperative at this time.  There have been no acute events since the last update.  Awaiting disposition plan from Social Work.   , MD 09/19/19 [phone]

## 2019-09-19 NOTE — TOC Progression Note (Signed)
Transition of Care [facility]) - Progression Note    Patient Details  Name: Stephen Mcknight MRN: [id number] Date of Birth: [birth year]  Transition of Care [facility]) CM/SW Contact  [place] Cellar, RN Phone Number: 09/19/2019, 3:01 PM  Clinical Narrative:    Spoke with Blima Rich, [phone], APS social worker who was calling requesting discharge disposition. Advised patient has no specific disposition at this time however plan is for LTC placement.    Expected Discharge Plan: Skilled Nursing Facility    Expected Discharge Plan and Services Expected Discharge Plan: Skilled Nursing Facility     Post Acute Care Choice: Skilled Nursing Facility Living arrangements for the past 2 months: Mobile Home                                       Social Determinants of Health (SDOH) Interventions    Readmission Risk Interventions No flowsheet data found.

## 2019-09-19 NOTE — ED Notes (Signed)
Pt lying in bed awake with no concerns for staff at this time. Vitals WNL, will continue to monitor

## 2019-09-19 NOTE — ED Notes (Signed)
Pt's chux pad changed, no BM at this time. Sacral dressing intact. Pt refused breakfast tray, however was able to coax pt into drinking 1/2 cranberry juice cup, 1/2 applesauce cup, and some bacon/eggs/biscuit.

## 2019-09-19 NOTE — ED Notes (Signed)
Pt given dinner tray at this time. This RN helped this patient eat his dinner. Pt tolerated well. Will continue to [facility].r
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
## 2019-09-20 MED ORDER — DIAZEPAM 2 MG PO TABS
2.0000 mg | ORAL_TABLET | Freq: Once | ORAL | Status: AC
  Administered 2019-09-20: 15:00:00 2 mg via ORAL
  Filled 2019-09-20: qty 1

## 2019-09-20 NOTE — ED Notes (Addendum)
Pt was fed over 45 minute period by this RN. Pt drank 1.5 cartons of milk, 1 cup juice, 1.5 cups of applesauce, bowl of oatmeal, half a sausage patty, and half a fruit cup.

## 2019-09-20 NOTE — ED Provider Notes (Signed)
-----------------------------------------   6:18 AM on 09/20/2019 -----------------------------------------   Blood pressure 120/73, pulse 76, temperature 97.7 F (36.5 C), temperature source Oral, resp. rate 18, height 5\' 10"  (1.778 m), weight 83.9 kg, SpO2 100 %.  The patient is sleeping at this time.  There have been no acute events since the last update.  Awaiting disposition plan from Social Work team.   , MD 09/20/19 [phone]

## 2019-09-20 NOTE — ED Notes (Signed)
Continues to rest quietly with eyes closed, no acute distress noted. 

## 2019-09-20 NOTE — ED Notes (Signed)
Patient resting quietly, no acute distress noted. °

## 2019-09-20 NOTE — ED Notes (Signed)
Pt readjusted in bed with assist of heather, rn.

## 2019-09-20 NOTE — ED Notes (Signed)
New pad placed on wound. Pt gown changed. Pt provided clean warm blankets. Hand and face cleaned at this time.

## 2019-09-20 NOTE — ED Notes (Signed)
Spoke with patient's daughter, Chyrl Civatte, and gave update.

## 2019-09-20 NOTE — ED Notes (Signed)
Pt changed and repositioned in bed by this RN and Genelle Bal EDT. New miraplex dressing placed on pt's sacrum at this time. Pt fed one cup of apple sauce.

## 2019-09-20 NOTE — ED Notes (Signed)
Resting quietly with eyes closed, no acute distress noted. °

## 2019-09-20 NOTE — ED Notes (Signed)
Assumed care of patient.  Patient resting quietly at this time.

## 2019-09-20 NOTE — ED Notes (Signed)
Pt changed and placed in clean brief and given warm blankets.

## 2019-09-21 ENCOUNTER — Emergency Department: Payer: Medicare Other

## 2019-09-21 LAB — CBC WITH DIFFERENTIAL/PLATELET
Abs Immature Granulocytes: 0.01 10*3/uL (ref 0.00–0.07)
Basophils Absolute: 0.1 10*3/uL (ref 0.0–0.1)
Basophils Relative: 1 %
Eosinophils Absolute: 0.1 10*3/uL (ref 0.0–0.5)
Eosinophils Relative: 2 %
HCT: 35.2 % — ABNORMAL LOW (ref 39.0–52.0)
Hemoglobin: 11.8 g/dL — ABNORMAL LOW (ref 13.0–17.0)
Immature Granulocytes: 0 %
Lymphocytes Relative: 30 %
Lymphs Abs: 1.9 10*3/uL (ref 0.7–4.0)
MCH: 29.8 pg (ref 26.0–34.0)
MCHC: 33.5 g/dL (ref 30.0–36.0)
MCV: 88.9 fL (ref 80.0–100.0)
Monocytes Absolute: 0.4 10*3/uL (ref 0.1–1.0)
Monocytes Relative: 6 %
Neutro Abs: 4 10*3/uL (ref 1.7–7.7)
Neutrophils Relative %: 61 %
Platelets: 317 10*3/uL (ref 150–400)
RBC: 3.96 MIL/uL — ABNORMAL LOW (ref 4.22–5.81)
RDW: 12.5 % (ref 11.5–15.5)
WBC: 6.5 10*3/uL (ref 4.0–10.5)
nRBC: 0 % (ref 0.0–0.2)

## 2019-09-21 LAB — COMPREHENSIVE METABOLIC PANEL
ALT: 6 U/L (ref 0–44)
AST: 25 U/L (ref 15–41)
Albumin: 3.4 g/dL — ABNORMAL LOW (ref 3.5–5.0)
Alkaline Phosphatase: 63 U/L (ref 38–126)
Anion gap: 7 (ref 5–15)
BUN: 14 mg/dL (ref 8–23)
CO2: 31 mmol/L (ref 22–32)
Calcium: 8.8 mg/dL — ABNORMAL LOW (ref 8.9–10.3)
Chloride: 99 mmol/L (ref 98–111)
Creatinine, Ser: 0.87 mg/dL (ref 0.61–1.24)
GFR calc Af Amer: >60 mL/min (ref >60)
GFR calc non Af Amer: >60 mL/min (ref >60)
Glucose, Bld: 95 mg/dL (ref 70–99)
Potassium: 3.9 mmol/L (ref 3.5–5.1)
Sodium: 137 mmol/L (ref 135–145)
Total Bilirubin: 0.7 mg/dL (ref 0.3–1.2)
Total Protein: 6.3 g/dL — ABNORMAL LOW (ref 6.5–8.1)

## 2019-09-21 LAB — TROPONIN I (HIGH SENSITIVITY): Troponin I (High Sensitivity): 10 ng/L (ref <18)

## 2019-09-21 LAB — LACTIC ACID, PLASMA
Lactic Acid, Venous: 2.1 mmol/L (ref 0.5–1.9)
Lactic Acid, Venous: 1.1 mmol/L (ref 0.5–1.9)

## 2019-09-21 LAB — PROCALCITONIN: Procalcitonin: <0.1 ng/mL

## 2019-09-21 LAB — LIPASE, BLOOD: Lipase: 17 U/L (ref 11–51)

## 2019-09-21 LAB — GLUCOSE, CAPILLARY: Glucose-Capillary: 113 mg/dL — ABNORMAL HIGH (ref 70–99)

## 2019-09-21 MED ORDER — METHOCARBAMOL 500 MG PO TABS
1000.0000 mg | ORAL_TABLET | Freq: Every evening | ORAL | Status: DC | PRN
  Filled 2019-09-21: qty 2

## 2019-09-21 MED ORDER — MIRTAZAPINE 15 MG PO TABS
15.0000 mg | ORAL_TABLET | Freq: Every day | ORAL | Status: DC
  Administered 2019-09-21: 02:00:00 15 mg via ORAL
  Filled 2019-09-21: qty 1

## 2019-09-21 MED ORDER — HALOPERIDOL LACTATE 5 MG/ML IJ SOLN
INTRAMUSCULAR | Status: AC
  Administered 2019-09-21: 14:00:00 5 mg via INTRAVENOUS
  Filled 2019-09-21: qty 1

## 2019-09-21 MED ORDER — SODIUM CHLORIDE 0.9 % IV BOLUS (SEPSIS)
1000.0000 mL | Freq: Once | INTRAVENOUS | Status: AC
  Administered 2019-09-21: 1000 mL via INTRAVENOUS

## 2019-09-21 MED ORDER — AMLODIPINE BESYLATE 5 MG PO TABS
10.0000 mg | ORAL_TABLET | Freq: Every day | ORAL | Status: DC
  Administered 2019-09-21 – 2019-09-22 (×2): 10 mg via ORAL
  Filled 2019-09-21 (×2): qty 2

## 2019-09-21 MED ORDER — LORAZEPAM 0.5 MG PO TABS: 0.5000 mg | ORAL_TABLET | Freq: Four times a day (QID) | ORAL | Status: DC | PRN

## 2019-09-21 MED ORDER — HALOPERIDOL LACTATE 5 MG/ML IJ SOLN: 5.0000 mg | Freq: Once | INTRAMUSCULAR | Status: AC

## 2019-09-21 MED ORDER — CARBIDOPA-LEVODOPA 25-100 MG PO TABS: 1.0000 | ORAL_TABLET | Freq: Three times a day (TID) | ORAL | Status: DC

## 2019-09-21 MED ORDER — ASPIRIN EC 81 MG PO TBEC
81.0000 mg | DELAYED_RELEASE_TABLET | Freq: Every day | ORAL | Status: DC
  Administered 2019-09-21 – 2019-09-22 (×2): 81 mg via ORAL
  Filled 2019-09-21 (×2): qty 1

## 2019-09-21 MED ORDER — METOPROLOL TARTRATE 25 MG PO TABS
12.5000 mg | ORAL_TABLET | Freq: Two times a day (BID) | ORAL | Status: DC
  Administered 2019-09-21 – 2019-09-22 (×2): 12.5 mg via ORAL
  Filled 2019-09-21 (×2): qty 1

## 2019-09-21 MED ORDER — SODIUM CHLORIDE 0.9 % IV BOLUS (SEPSIS)
1000.0000 mL | Freq: Once | INTRAVENOUS | Status: AC
  Administered 2019-09-21: 14:00:00 1000 mL via INTRAVENOUS

## 2019-09-21 NOTE — ED Notes (Signed)
Pt making multiple attempts of climbing out of the bed, pt states he "wants to get out of here". Sitter at bedside.

## 2019-09-21 NOTE — ED Notes (Signed)
Received report from [place], Charity fundraiser. Care assumed at this time

## 2019-09-21 NOTE — Progress Notes (Signed)
Code Sepsis monitoring discontinued due to cancellation.  

## 2019-09-21 NOTE — ED Notes (Signed)
Had dr Larinda Buttery check pt's arm. Rt arm edematous from prior infiltration with swelling from mid forearm to shoulder. Pulses palpable. Both iv's removed.

## 2019-09-21 NOTE — ED Notes (Signed)
Attempted to call daughter to update with no answer.

## 2019-09-21 NOTE — ED Notes (Signed)
Pt drank half carton of milk. Refused to eat lunch.

## 2019-09-21 NOTE — ED Notes (Addendum)
Attempted to give oral meds again; patient would not awaken; he opened eyes slightly but would not open mouth to take meds.  [place] MD aware.

## 2019-09-21 NOTE — Progress Notes (Signed)
Notified provider and bedside nurse of need to order antibiotics.  reply back from RN was No not right away. Patient may not neccissarily be a Septic patient. Patient symptoms were hypotension and bradycardia, nothing else and it happpend quickly so care was quickly initiated.   I have asked if sepsis is not needed if the dr would consider discontinuing the order.

## 2019-09-21 NOTE — ED Notes (Signed)
Attempted to give oral meds (due 1800) without success, unable to rouse patient possibly due to lingering effects of valium and haldol given earlier.  Vitals are stable at this time.  Will attempt again in the future; [place] aware.

## 2019-09-21 NOTE — ED Notes (Signed)
Pt resting comfortable, no distress, Sitter released, pt asleep.

## 2019-09-21 NOTE — ED Provider Notes (Signed)
Procedures     ----------------------------------------- 3:06 PM on 09/21/2019 -----------------------------------------  Patient had documented low blood pressure which I think is due to dehydration from poor oral intake.  Other vital signs are unremarkable, patient is not septic, he is behaving at baseline, breathing comfortably.  Prior workup was unremarkable.  Repeated sepsis panel, gave IV fluids with immediate improvement of blood pressure.  Repeat lab panel today unremarkable.  Cultures repeated.  procalcitonin negative. covid negative. UA normal. Troponin normal.  Lactate 2.1, due to dehydration. will need to repeat after IVF.    Sharman Cheek, MD 09/21/19 [phone]

## 2019-09-21 NOTE — ED Notes (Signed)
Pt brief saturated with urine. Pt cleaned with wipes. New brief and chuck placed on pt. Pt rolled from L side lying to R side lying and propped with pillows. Noted red area, possibly stage 1 pressure sore noted to L hip. Towel placed in between knees to take pressure off joints touching to decrease pressure on knee joints. Pt covered with warm blankets. Pt went back to sleep after being moved.   Pt ate 1 bowl oatmeal, half a carton of milk. Asked pt if he wanted more to eat and he replied "no" twice and would not open mouth for more food/drink.   Crushed pt meds into oatmeal and pt took with ease.

## 2019-09-22 ENCOUNTER — Emergency Department: Payer: Medicare Other

## 2019-09-22 LAB — CBC WITH DIFFERENTIAL/PLATELET
Abs Immature Granulocytes: 0.04 10*3/uL (ref 0.00–0.07)
Basophils Absolute: 0.1 10*3/uL (ref 0.0–0.1)
Basophils Relative: 1 %
Eosinophils Absolute: 0.1 10*3/uL (ref 0.0–0.5)
Eosinophils Relative: 2 %
HCT: 34.8 % — ABNORMAL LOW (ref 39.0–52.0)
Hemoglobin: 11.7 g/dL — ABNORMAL LOW (ref 13.0–17.0)
Immature Granulocytes: 1 %
Lymphocytes Relative: 32 %
Lymphs Abs: 2.2 10*3/uL (ref 0.7–4.0)
MCH: 30.1 pg (ref 26.0–34.0)
MCHC: 33.6 g/dL (ref 30.0–36.0)
MCV: 89.5 fL (ref 80.0–100.0)
Monocytes Absolute: 0.6 10*3/uL (ref 0.1–1.0)
Monocytes Relative: 8 %
Neutro Abs: 4.1 10*3/uL (ref 1.7–7.7)
Neutrophils Relative %: 56 %
Platelets: 265 10*3/uL (ref 150–400)
RBC: 3.89 MIL/uL — ABNORMAL LOW (ref 4.22–5.81)
RDW: 12.7 % (ref 11.5–15.5)
WBC: 7.1 10*3/uL (ref 4.0–10.5)
nRBC: 0 % (ref 0.0–0.2)

## 2019-09-22 LAB — BASIC METABOLIC PANEL
Anion gap: 4 — ABNORMAL LOW (ref 5–15)
BUN: 12 mg/dL (ref 8–23)
CO2: 24 mmol/L (ref 22–32)
Calcium: 7.4 mg/dL — ABNORMAL LOW (ref 8.9–10.3)
Chloride: 113 mmol/L — ABNORMAL HIGH (ref 98–111)
Creatinine, Ser: 0.57 mg/dL — ABNORMAL LOW (ref 0.61–1.24)
GFR calc Af Amer: >60 mL/min (ref >60)
GFR calc non Af Amer: >60 mL/min (ref >60)
Glucose, Bld: 78 mg/dL (ref 70–99)
Potassium: 3.2 mmol/L — ABNORMAL LOW (ref 3.5–5.1)
Sodium: 141 mmol/L (ref 135–145)

## 2019-09-22 LAB — URINALYSIS, COMPLETE (UACMP) WITH MICROSCOPIC
Bilirubin Urine: NEGATIVE
Glucose, UA: 50 mg/dL — AB
Ketones, ur: 5 mg/dL — AB
Leukocytes,Ua: NEGATIVE
Nitrite: NEGATIVE
Protein, ur: NEGATIVE mg/dL
Specific Gravity, Urine: 1.016 (ref 1.005–1.030)
pH: 5 (ref 5.0–8.0)

## 2019-09-22 LAB — GLUCOSE, CAPILLARY: Glucose-Capillary: 78 mg/dL (ref 70–99)

## 2019-09-22 MED ORDER — DEXTROSE 50 % IV SOLN
1.0000 | Freq: Once | INTRAVENOUS | Status: AC
  Administered 2019-09-22: 02:00:00 50 mL via INTRAVENOUS
  Filled 2019-09-22: qty 50

## 2019-09-22 MED ORDER — SODIUM CHLORIDE 0.9 % IV SOLN: Freq: Once | INTRAVENOUS | Status: AC

## 2019-09-22 NOTE — Discharge Instructions (Signed)
Stephen Mcknight should return to the emergency department for repeat evaluation for new or worsening behavioral problems, change in mental status, other mental health concerns, worsening of his pressure injury, or any other new or worsening symptoms that are concerning.

## 2019-09-22 NOTE — ED Notes (Signed)
Remeron not given due to sedative effect per Dr. Dolores Frame.

## 2019-09-22 NOTE — ED Notes (Signed)
This RN spoke with case Production designer, theatre/television/film. Case manager wondering if pt medically cleared before pt getting bed at [facility]. This RN discussed pt with MD Siadecki who stated pt labs were okay to D/C pt to facility. This RN notified case manager Darl Pikes, RN who stated she would let this RN known when bed is approved at [facility].

## 2019-09-22 NOTE — ED Notes (Signed)
Pt unable to sign for self due to hx of dementia. Pt care discussed with caseworker. Pt case manager verbalized understanding of care plan. Per case worker, no family is involved in pt care. Pt in NAD at time of D/C to [facility].

## 2019-09-22 NOTE — ED Notes (Addendum)
Patient found to be incontinent of urine.  Beddings and brief changed, sacral pad replaced, pt repositioned.  Pt woke to take oral medications and is sleeping again.

## 2019-09-22 NOTE — TOC Progression Note (Addendum)
Transition of Care [facility]) - Progression Note    Patient Details  Name: Stephen Mcknight MRN: [id number] Date of Birth: 07-02-1946  Transition of Care [facility]) CM/SW Contact  [place] Cellar, RN Phone Number: 09/22/2019, 2:24 PM  Clinical Narrative:    Attempt to leave message for [name], [phone] however voicemail full. Patient accepted to [facility] per [place]. Can admit once medically stable.    Expected Discharge Plan: Skilled Nursing Facility    Expected Discharge Plan and Services Expected Discharge Plan: Skilled Nursing Facility     Post Acute Care Choice: Skilled Nursing Facility Living arrangements for the past 2 months: Mobile Home                                       Social Determinants of Health (SDOH) Interventions    Readmission Risk Interventions No flowsheet data found.

## 2019-09-22 NOTE — ED Notes (Signed)
Pt has been sleeping most of the night with only brief periods of waking several times.  Pt is sleeping on his left side at this time, pillow placed between legs for comfort and to prevent skin breakdown at knees.  Bed alarm is active and external urinary catheter is in place.

## 2019-09-22 NOTE — TOC Transition Note (Addendum)
Transition of Care [facility]) - CM/SW Discharge Note   Patient Details  Name: Euan Wandler MRN: [id number] Date of Birth: 1946-05-05  Transition of Care [facility]) CM/SW Contact:  [place] Cellar, RN Phone Number: 09/22/2019, 2:39 PM   Clinical Narrative:    Admitting to [facility] room 703. Call report to [phone] and ask for 700 hall nurse. Alvino Chapel @ [facility] aware and states they will continue to follow. Nurse notified. TOC signing off.          Patient Goals and CMS Choice Patient states their goals for this hospitalization and ongoing recovery are:: Patient needs long term care with full assistance - as per guardian.      Discharge Placement                       Discharge Plan and Services     Post Acute Care Choice: Skilled Nursing Facility                               Social Determinants of Health (SDOH) Interventions     Readmission Risk Interventions No flowsheet data found.

## 2019-09-22 NOTE — TOC Progression Note (Signed)
Transition of Care [facility]) - Progression Note    Patient Details  Name: Stephen Mcknight MRN: [id number] Date of Birth: May 01, 1946  Transition of Care [facility]) CM/SW Contact  [place] Cellar, RN Phone Number: 09/22/2019, 12:52 PM  Clinical Narrative:    Sherron Monday to [name], [phone] DSS states patient will be private pay due to monthly income of $2188 and money in bank account which has guardian of estate through DSS. RN CM will send out Fl2.     Expected Discharge Plan: Skilled Nursing Facility    Expected Discharge Plan and Services Expected Discharge Plan: Skilled Nursing Facility     Post Acute Care Choice: Skilled Nursing Facility Living arrangements for the past 2 months: Mobile Home                                       Social Determinants of Health (SDOH) Interventions    Readmission Risk Interventions No flowsheet data found.

## 2019-09-22 NOTE — ED Notes (Signed)
Recollect green and lavender tube sent to lab.

## 2019-09-22 NOTE — ED Provider Notes (Signed)
Urine shows mild dehydration but is otherwise unremarkable.  No evidence of acute, new infection.  Repeat labs show normal white blood cell count.  BMP is pending. No apparent indication for medical admission at this time based on labs.   Shaune Pollack, MD 09/22/19 [phone]

## 2019-09-22 NOTE — ED Notes (Signed)
In/out cath performed, no urine obtained.  External urinary catheter placed.

## 2019-09-22 NOTE — TOC Progression Note (Addendum)
Transition of Care [facility]) - Progression Note    Patient Details  Name: Stephen Mcknight MRN: [id number] Date of Birth: 1946/06/30  Transition of Care [facility]) CM/SW Contact  [place] Cellar, RN Phone Number: 09/22/2019, 12:23 PM  Clinical Narrative:    Sherron Monday to Blima Rich, [phone], APS SW who states patient was denied Medicaid due to resources. Has new social worker @ DSS [name], [phone]. Spoke to [place] who states she was not aware that member had been denied Medicaid and she would investigate further in order to clarify payer source for placement.    Expected Discharge Plan: Skilled Nursing Facility    Expected Discharge Plan and Services Expected Discharge Plan: Skilled Nursing Facility     Post Acute Care Choice: Skilled Nursing Facility Living arrangements for the past 2 months: Mobile Home                                       Social Determinants of Health (SDOH) Interventions    Readmission Risk Interventions No flowsheet data found.

## 2019-09-22 NOTE — ED Notes (Signed)
Patient remains difficult to arouse.  Required painful stimuli to elicit verbal response and movement of extremities.  Additionally, patient has had no urine output since around 0300, from nursing report.  Dr. Erma Heritage alerted.  Orders obtained and completed.  Continue to monitor.

## 2019-09-22 NOTE — ED Provider Notes (Signed)
-----------------------------------------   2:57 PM on 09/22/2019 -----------------------------------------  The patient has been accepted to a facility.  At this time, he is stable for discharge.  His vital signs have remained stable over the course of the day, and labs obtained earlier today are within normal limits.  Return precautions have been provided.   Dionne Bucy, MD 09/22/19 [phone]

## 2019-09-22 NOTE — ED Provider Notes (Signed)
-----------------------------------------   1:03 AM on 09/22/2019 -----------------------------------------  Patient reportedly had some episodes of hypotension in the afternoon.  Subsequently he was agitated and given IM Haldol and oral Valium.  He is still drowsy, presumably from the medications and has not been able to take his evening or nighttime meds.  Blood sugar 78.  Will give dextrose.  Patient had repeat lab work, chest x-ray and lactic acid in the afternoon which were unremarkable.  Will obtain urine by in and out cath, CT head.  Vital signs stable.   ----------------------------------------- 2:11 AM on 09/22/2019 -----------------------------------------  CT head unremarkable.  Patient did actually wake up enough and was able to take his 6 PM medicines along with some applesauce.  Urine pending.  Will continue to monitor.   ----------------------------------------- 3:39 AM on 09/22/2019 -----------------------------------------  Unsuccessful in and out cath.  Condom catheter placed as patient had been incontinent of urine earlier in the evening.   ----------------------------------------- 6:23 AM on 09/22/2019 -----------------------------------------  No further events overnight.  Patient remains hemodynamically stable.  Disposition per social work.   Irean Hong, MD 09/22/19 [phone]

## 2019-09-22 NOTE — ED Notes (Signed)
Turned to right side

## 2019-09-23 LAB — URINE CULTURE

## 2019-09-26 LAB — CULTURE, BLOOD (ROUTINE X 2)
Culture: NO GROWTH
Culture: NO GROWTH
Special Requests: ADEQUATE

## 2019-10-30 DEATH — deceased

## 2020-09-08 IMAGING — CT CT HEAD WITHOUT CONTRAST
3 of 10 series · 14 of 47 positions shown, 16 images · non-contrast
Comparison: Head CT, 06/28/2018.  Cervical spine CT, 01/18/2017.

CLINICAL DATA: Pt arrived from home via EMS for report of fall
sometime between yesterday and today - he was found naked laying
face down in floor beside bed - pt has [employer] that he sent
home [REDACTED] and since then has fallen 3 times - he is c/o lt arm
pain and right hip pain - pt noted to have hematoma above right eye
and right eye appears to have yellow discharge

EXAM:
CT HEAD WITHOUT CONTRAST
CT MAXILLOFACIAL WITHOUT CONTRAST
CT CERVICAL SPINE WITHOUT CONTRAST
TECHNIQUE: Multidetector CT imaging of the head, cervical spine, and
maxillofacial structures were performed using the standard protocol
without intravenous contrast. Multiplanar CT image reconstructions
of the cervical spine and maxillofacial structures were also
generated.

[Series 11: orthogonal axials · axial · 0.23mm/px · z∈[+1306,+1443]mm · 8 of 101 slices shown, 10 images]
[im 12/101  brain]
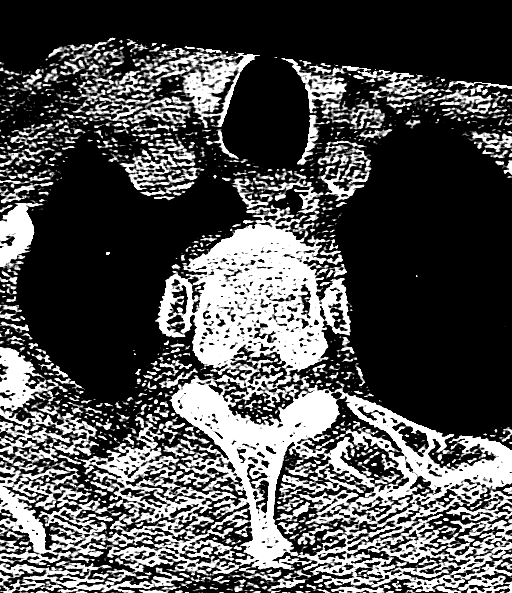
[im 12/101  bone]
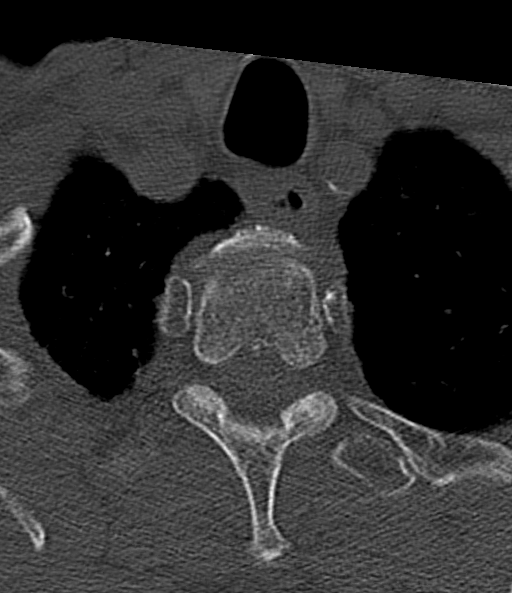
[im 23/101  brain]
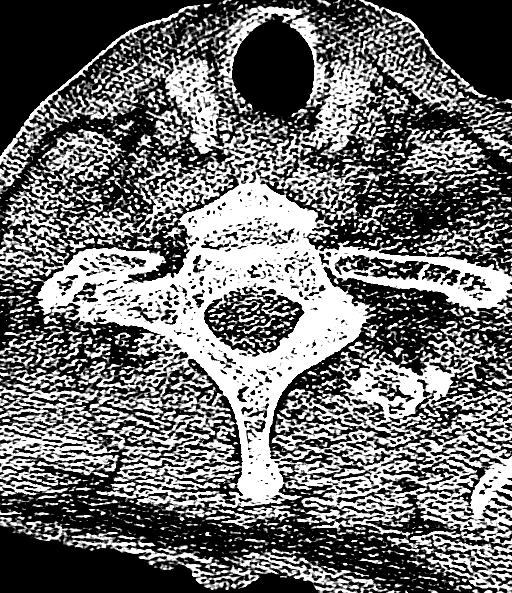
[im 34/101  brain]
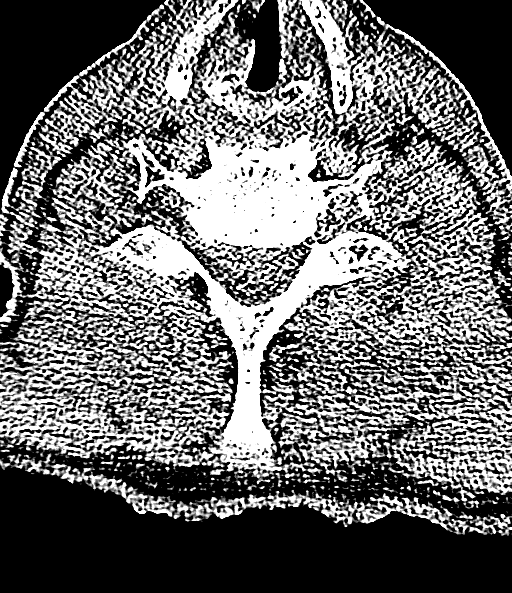
[im 45/101  brain]
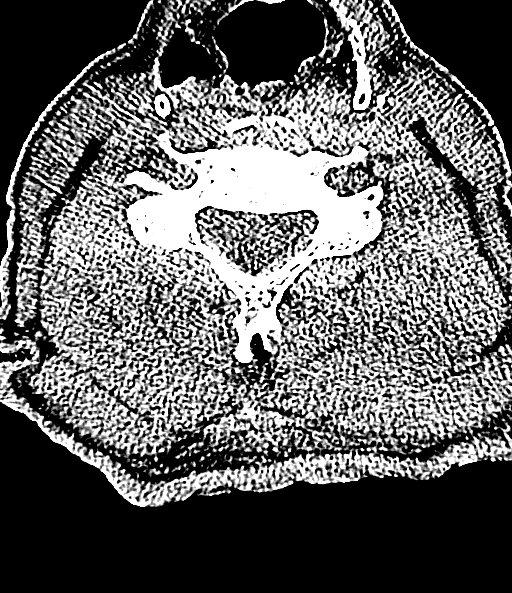
[im 56/101  brain]
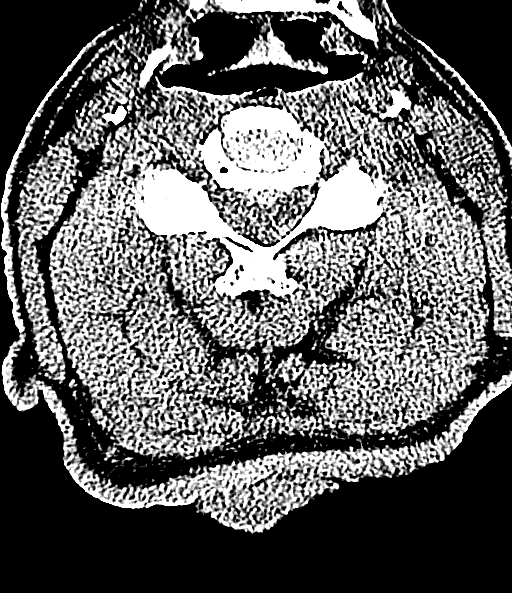
[im 56/101  bone]
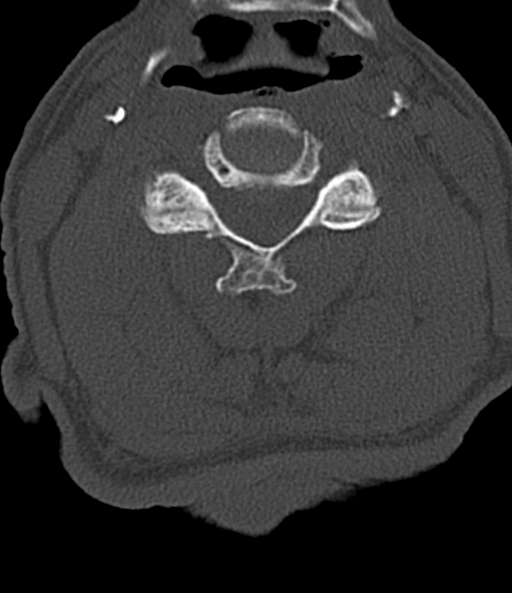
[im 67/101  brain]
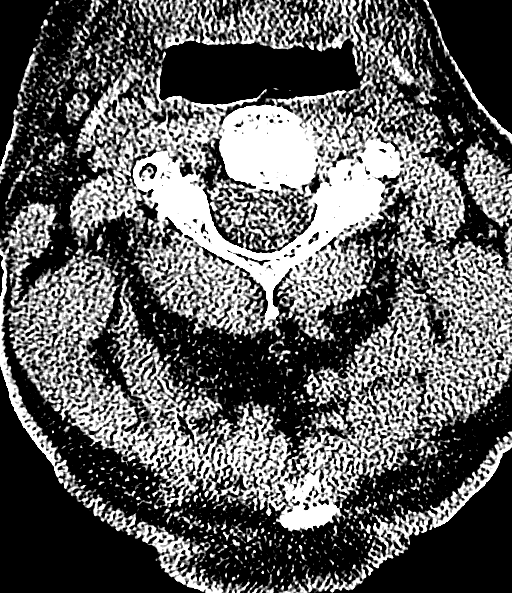
[im 78/101  brain]
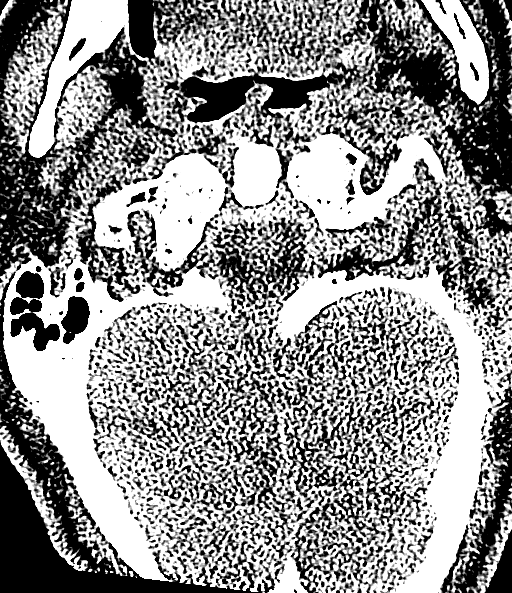
[im 89/101  brain]
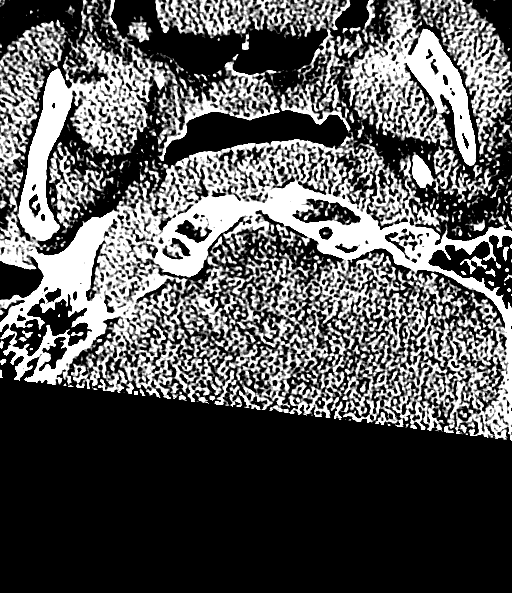

[Series 12: max soft · axial · 0.29mm/px · z∈[+1398,+1442]mm · 3 of 89 slices shown]
[im 12/89  brain]
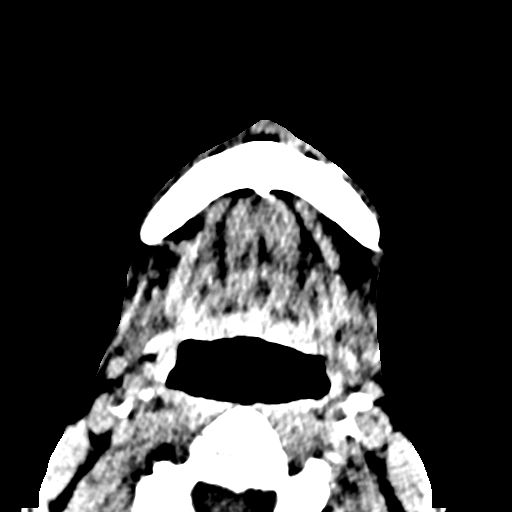
[im 23/89  brain]
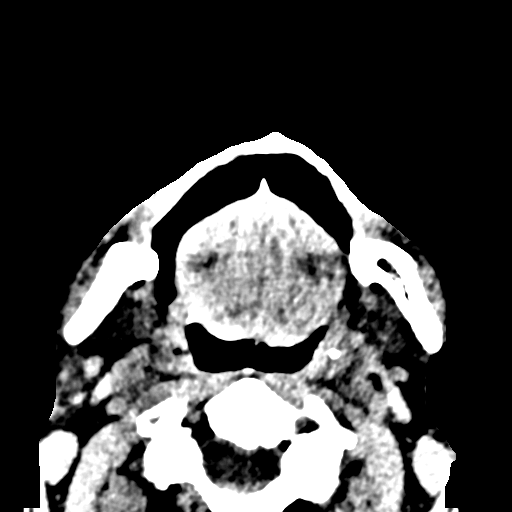
[im 34/89  brain]
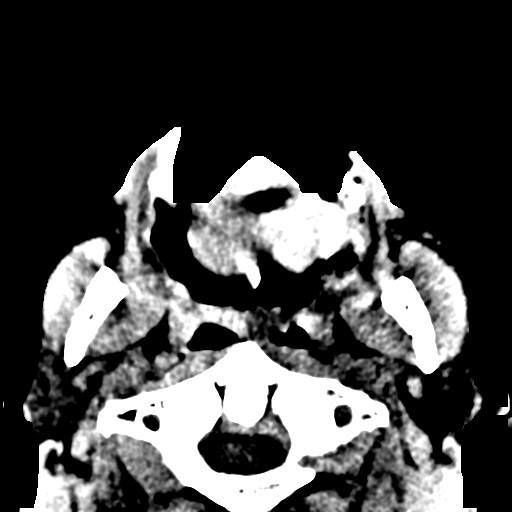

[Series 16: coronal soft · coronal · 0.29mm/px · 3 of 76 slices shown]
[im 26/76  brain]
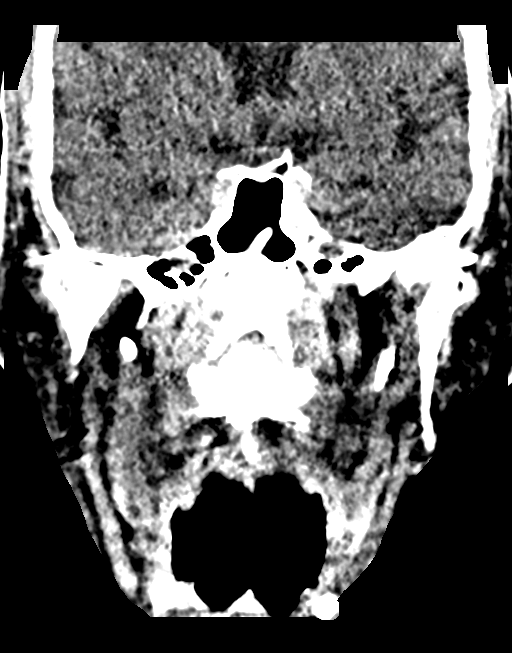
[im 38/76  brain]
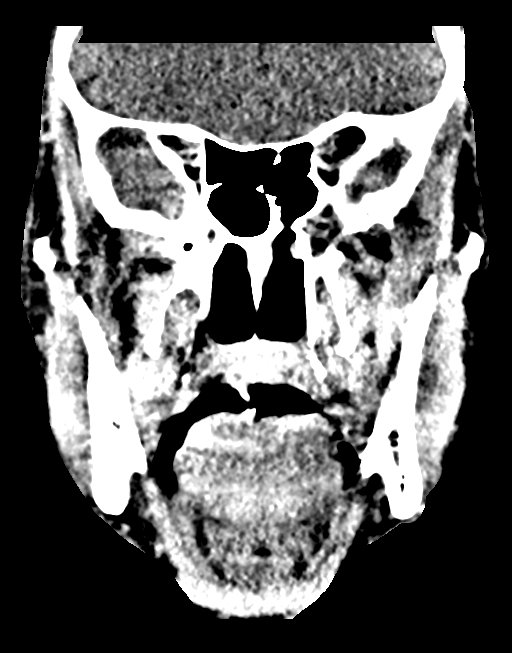
[im 51/76  brain]
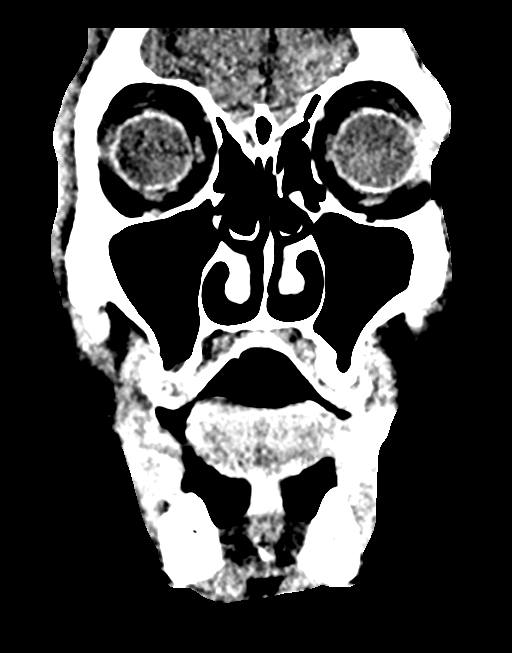

[14 of 47 positions shown; findings below may reference images not displayed]

FINDINGS: CT HEAD FINDINGS

Brain: No evidence of acute infarction, hemorrhage, hydrocephalus,
extra-axial collection or mass lesion/mass effect.

Vascular: No hyperdense vessel or unexpected calcification.

Skull: Normal. Negative for fracture or focal lesion.

Other: Right inferior frontal scalp hematoma extending to the
periorbital soft tissues.

CT MAXILLOFACIAL FINDINGS

Osseous: No fracture or bone lesion.

Orbits: There is preseptal right periorbital soft tissue
swelling/hemorrhage extending to the periorbital soft tissues, and
along the right cheek and lower right forehead. No injury to the
right globe or postseptal orbit. Left globe and orbit are
unremarkable.

Sinuses: Clear sinuses, mastoid air cells and middle ear cavities.

Soft tissues: No soft tissue masses.  No adenopathy.

CT CERVICAL SPINE FINDINGS

Alignment: Normal.

Skull base and vertebrae: No acute fracture. No primary bone lesion
or focal pathologic process.

Soft tissues and spinal canal: No prevertebral fluid or swelling. No
visible canal hematoma.

No soft tissue masses or adenopathy.

Disc levels: Moderate loss of disc height at C5-C6 and C6-C7 with
spondylotic disc bulging and endplate spurring. Facet degenerative
changes are noted most evident the left at C2-C3 and C4-C5. No
convincing disc herniation.

Upper chest: No acute findings. Chronic scarring at the lung apices.

Other: None.
IMPRESSION: HEAD CT

1. No intracranial abnormality.
2. No skull fracture.

MAXILLOFACIAL CT

1. Right periorbital soft tissue swelling/hematoma extending to the
inferior right forehead and right cheek. No injury to the right
globe or postseptal orbit. No other acute finding.
2. No fractures.

CERVICAL CT

1. No fracture or acute finding.

## 2021-04-05 IMAGING — CR DG CHEST 2V
3 series · 3 of 3 positions shown · non-contrast
Comparison: Chest radiograph dated 02/20/2019.

CLINICAL DATA: 74-year-old male with altered mental status.

EXAM:
CHEST - 2 VIEW

[chest ap]
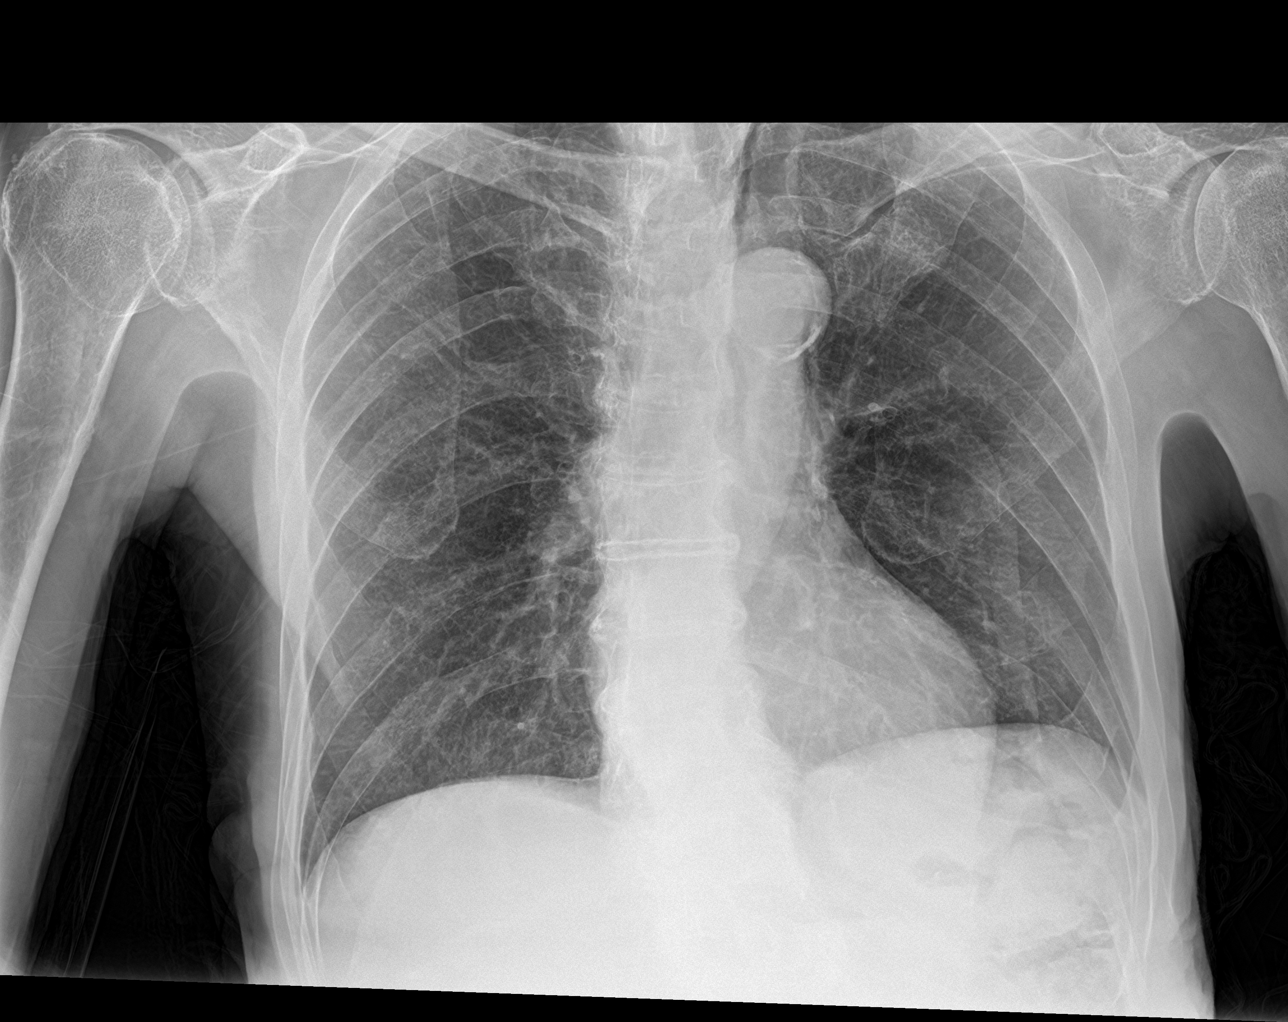

[chest lat (1 of 2)]
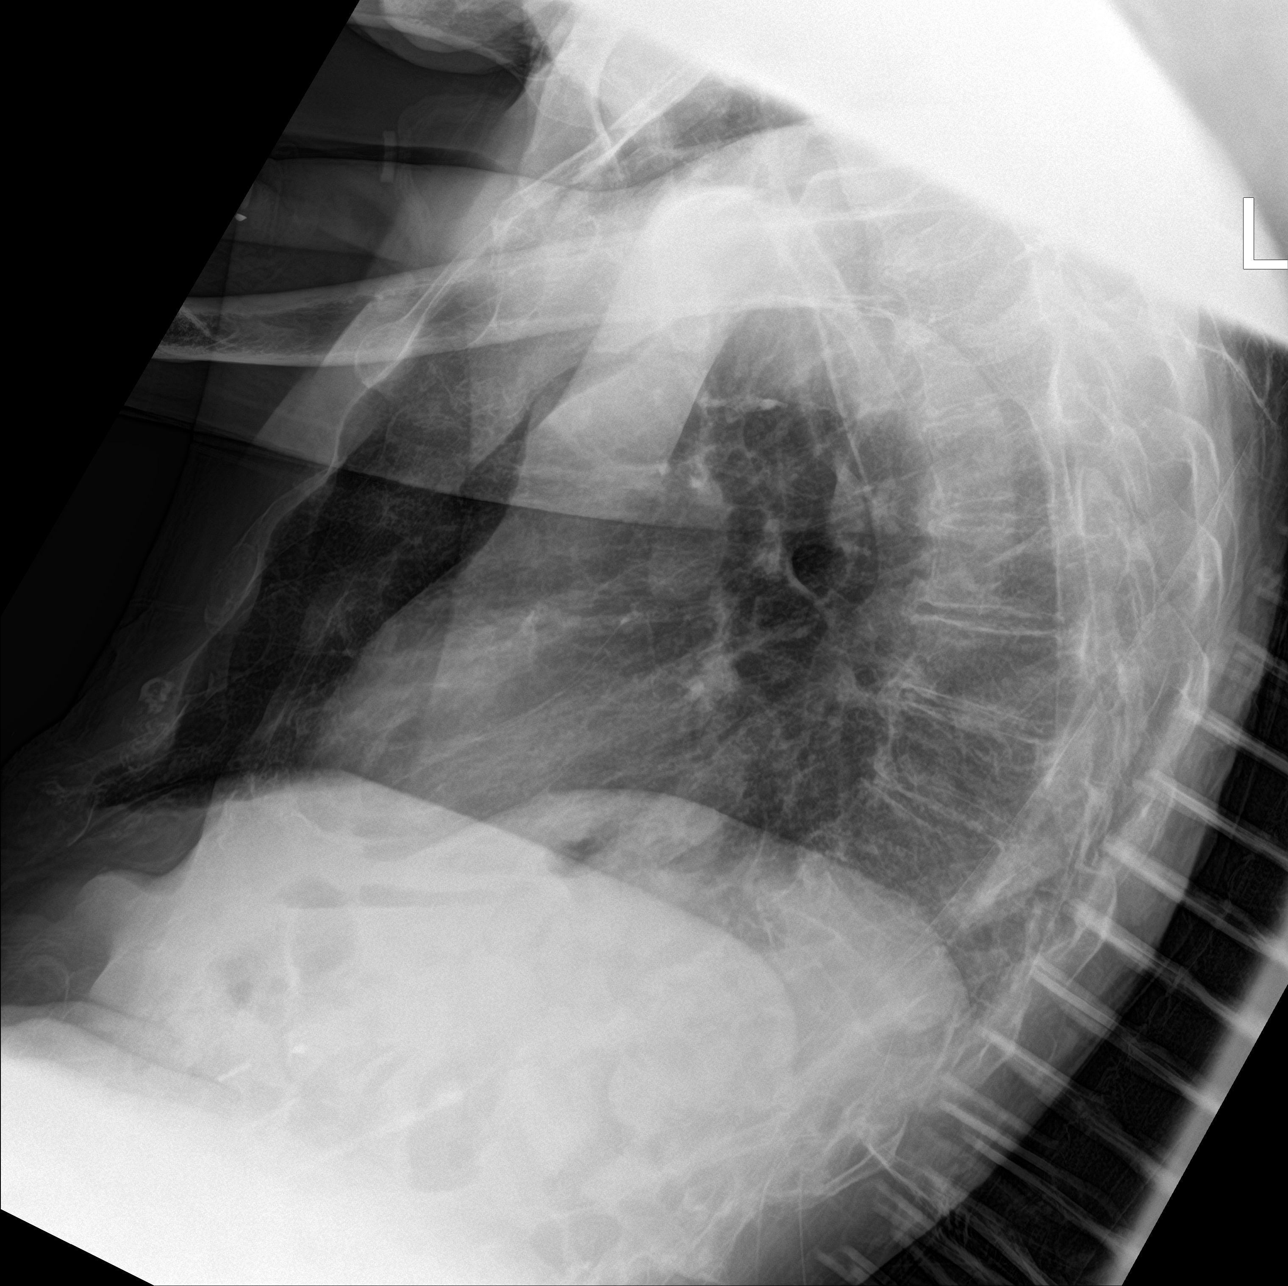

[chest lat (2 of 2)]
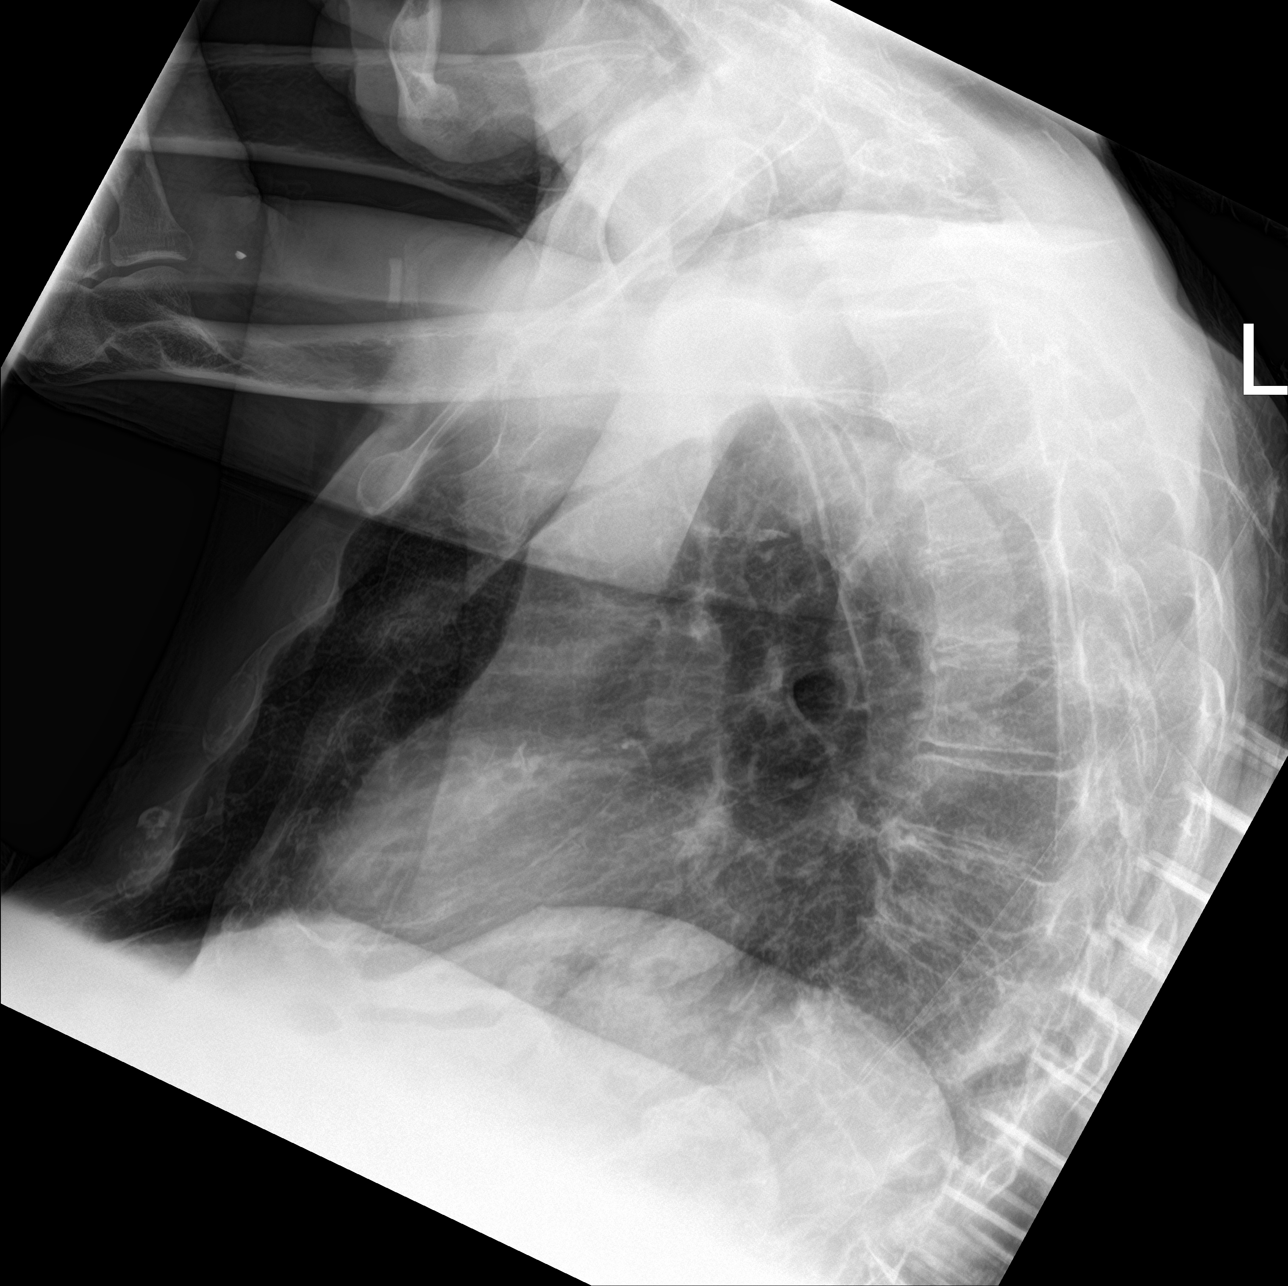

[3 of 3 positions shown; findings below may reference images not displayed]

FINDINGS: Background of emphysema and chronic bronchitic changes. No focal
consolidation, pleural effusion, or pneumothorax. The cardiac
silhouette is within normal limits. Atherosclerotic calcification of
the aorta. No acute osseous pathology. Degenerative changes of the
spine.
IMPRESSION: 1. No acute cardiopulmonary process.
2. Aortic Atherosclerosis ([id number].S) and Emphysema ([id number].4).
